# Patient Record
Sex: Male | Born: 1985 | Race: White | Hispanic: No | Marital: Single | Smoking: Current every day smoker
Health system: Southern US, Community
[De-identification: ages and names within clinical notes are randomized; demographics above are authoritative.]

## PROBLEM LIST (undated history)

## (undated) DIAGNOSIS — N201 Calculus of ureter: Secondary | ICD-10-CM

---

## 2008-02-12 ENCOUNTER — Emergency Department (HOSPITAL_COMMUNITY): Admission: EM | Admit: 2008-02-12 | Discharge: 2008-02-12 | Payer: Self-pay | Admitting: Emergency Medicine

## 2008-03-01 ENCOUNTER — Emergency Department (HOSPITAL_COMMUNITY): Admission: EM | Admit: 2008-03-01 | Discharge: 2008-03-01 | Payer: Self-pay | Admitting: Emergency Medicine

## 2008-09-12 ENCOUNTER — Ambulatory Visit: Payer: Self-pay | Admitting: Oncology

## 2008-09-19 LAB — CBC WITH DIFFERENTIAL/PLATELET
BASO%: 0.6 % (ref 0.0–2.0)
Basophils Absolute: 0.1 10*3/uL (ref 0.0–0.1)
EOS%: 3.4 % (ref 0.0–7.0)
MCH: 30.6 pg (ref 27.2–33.4)
MCHC: 34.6 g/dL (ref 32.0–36.0)
MCV: 88.7 fL (ref 79.3–98.0)
MONO%: 5.4 % (ref 0.0–14.0)
RBC: 5.06 10*6/uL (ref 4.20–5.82)
RDW: 13.6 % (ref 11.0–14.6)
lymph#: 3.5 10*3/uL — ABNORMAL HIGH (ref 0.9–3.3)

## 2008-09-19 LAB — MORPHOLOGY

## 2008-09-21 LAB — COMPREHENSIVE METABOLIC PANEL
BUN: 12 mg/dL (ref 6–23)
CO2: 21 mEq/L (ref 19–32)
Glucose, Bld: 79 mg/dL (ref 70–99)
Sodium: 138 mEq/L (ref 135–145)
Total Bilirubin: 0.5 mg/dL (ref 0.3–1.2)
Total Protein: 7.6 g/dL (ref 6.0–8.3)

## 2008-09-21 LAB — LACTATE DEHYDROGENASE: LDH: 118 U/L (ref 94–250)

## 2008-09-21 LAB — URIC ACID: Uric Acid, Serum: 6.5 mg/dL (ref 4.0–7.8)

## 2008-09-27 LAB — PORPHYRINS, FRACTIONATED URINE (TIMED COLLECTION)
Heptacarboxyl Porphyrin, 24H Ur: 0 umol/mol CRT (ref 0–2)
Porphyrins Interpretation: NORMAL
Time-PORPF: 24 hr
Total Volume, Urine: 800 mL
Uroporphyrin, Urine: 1 umol/mol CRT (ref 0–4)

## 2008-09-27 LAB — AMINOLEVULINIC ACID, 24 HOUR
Creatinine, Urine mg/dL-HMCYUR: 1520 mg/d (ref 1000–2500)
Creatinine, Urine-mg/dL-ALA: 190 mg/dL
Time-ALA: 24 hr
Total Volume, Urine: 800 mL

## 2008-10-18 ENCOUNTER — Ambulatory Visit: Payer: Self-pay | Admitting: Oncology

## 2009-04-19 ENCOUNTER — Emergency Department (HOSPITAL_COMMUNITY): Admission: EM | Admit: 2009-04-19 | Discharge: 2009-04-19 | Payer: Self-pay | Admitting: Emergency Medicine

## 2009-05-22 ENCOUNTER — Emergency Department (HOSPITAL_COMMUNITY): Admission: EM | Admit: 2009-05-22 | Discharge: 2009-05-22 | Payer: Self-pay | Admitting: Emergency Medicine

## 2010-04-23 LAB — DIFFERENTIAL
Basophils Absolute: 0 10*3/uL (ref 0.0–0.1)
Basophils Relative: 0 % (ref 0–1)
Eosinophils Relative: 1 % (ref 0–5)
Lymphocytes Relative: 15 % (ref 12–46)
Neutro Abs: 10.3 10*3/uL — ABNORMAL HIGH (ref 1.7–7.7)

## 2010-04-23 LAB — CBC
HCT: 46.5 % (ref 39.0–52.0)
MCHC: 35.5 g/dL (ref 30.0–36.0)
Platelets: 238 10*3/uL (ref 150–400)
RDW: 13 % (ref 11.5–15.5)

## 2010-04-23 LAB — POCT I-STAT, CHEM 8
Glucose, Bld: 92 mg/dL (ref 70–99)
HCT: 50 % (ref 39.0–52.0)
Hemoglobin: 17 g/dL (ref 13.0–17.0)
Potassium: 3.6 mEq/L (ref 3.5–5.1)
TCO2: 24 mmol/L (ref 0–100)

## 2010-04-23 LAB — PROTIME-INR: Prothrombin Time: 13.6 seconds (ref 11.6–15.2)

## 2010-04-29 LAB — COMPREHENSIVE METABOLIC PANEL
BUN: 12 mg/dL (ref 6–23)
CO2: 24 mEq/L (ref 19–32)
Calcium: 9.3 mg/dL (ref 8.4–10.5)
Creatinine, Ser: 0.78 mg/dL (ref 0.4–1.5)
GFR calc non Af Amer: >60 mL/min (ref >60)
Glucose, Bld: 75 mg/dL (ref 70–99)
Sodium: 139 mEq/L (ref 135–145)
Total Protein: 7.6 g/dL (ref 6.0–8.3)

## 2010-04-29 LAB — DIFFERENTIAL
Lymphocytes Relative: 26 % (ref 12–46)
Lymphs Abs: 3.8 10*3/uL (ref 0.7–4.0)
Monocytes Relative: 6 % (ref 3–12)
Neutro Abs: 9.3 10*3/uL — ABNORMAL HIGH (ref 1.7–7.7)
Neutrophils Relative %: 64 % (ref 43–77)

## 2010-04-29 LAB — CBC
MCHC: 34.3 g/dL (ref 30.0–36.0)
Platelets: 266 10*3/uL (ref 150–400)
RBC: 5.11 MIL/uL (ref 4.22–5.81)
RDW: 13.5 % (ref 11.5–15.5)

## 2010-04-29 LAB — TROPONIN I: Troponin I: 0.01 ng/mL (ref 0.00–0.06)

## 2010-04-29 LAB — CK TOTAL AND CKMB (NOT AT ARMC)
CK, MB: 2.1 ng/mL (ref 0.3–4.0)
Total CK: 155 U/L (ref 7–232)

## 2010-05-20 LAB — CBC
HCT: 47.7 % (ref 39.0–52.0)
MCV: 89.8 fL (ref 78.0–100.0)
Platelets: 270 10*3/uL (ref 150–400)
RBC: 5.32 MIL/uL (ref 4.22–5.81)
WBC: 13.8 10*3/uL — ABNORMAL HIGH (ref 4.0–10.5)

## 2010-05-20 LAB — COMPREHENSIVE METABOLIC PANEL
AST: 16 U/L (ref 0–37)
Albumin: 4.4 g/dL (ref 3.5–5.2)
Alkaline Phosphatase: 71 U/L (ref 39–117)
BUN: 11 mg/dL (ref 6–23)
CO2: 28 mEq/L (ref 19–32)
Chloride: 105 mEq/L (ref 96–112)
Creatinine, Ser: 0.87 mg/dL (ref 0.4–1.5)
GFR calc non Af Amer: >60 mL/min (ref >60)
Potassium: 3.8 mEq/L (ref 3.5–5.1)
Total Bilirubin: 1 mg/dL (ref 0.3–1.2)

## 2010-05-20 LAB — URINALYSIS, ROUTINE W REFLEX MICROSCOPIC
Bilirubin Urine: NEGATIVE
Ketones, ur: 15 mg/dL — AB
Nitrite: NEGATIVE
Protein, ur: NEGATIVE mg/dL
Urobilinogen, UA: 1 mg/dL (ref 0.0–1.0)

## 2010-05-20 LAB — DIFFERENTIAL
Basophils Absolute: 0.1 10*3/uL (ref 0.0–0.1)
Basophils Relative: 1 % (ref 0–1)
Eosinophils Relative: 3 % (ref 0–5)
Lymphocytes Relative: 25 % (ref 12–46)
Monocytes Absolute: 0.8 10*3/uL (ref 0.1–1.0)

## 2010-06-17 IMAGING — CT CT PELVIS W/ CM
2 of 5 series · 17 of 46 positions shown, 19 images · IV contrast (omniscan)
Comparison: None

CT ABDOMEN

CLINICAL DATA: Right lower quadrant abdominal pain, nausea

CT ABDOMEN AND PELVIS WITH CONTRAST
TECHNIQUE: Multidetector CT imaging of the abdomen and pelvis was
performed using the standard protocol following bolus
administration of intravenous contrast.
Contrast: 125 ml Omniscan 300 IV contrast

[Series 2: abd_pel 5.0 b40f st · axial · 0.73mm/px · z∈[-568,-138]mm · 14 of 98 slices shown, 16 images]
[im 6/98  soft-tissue]
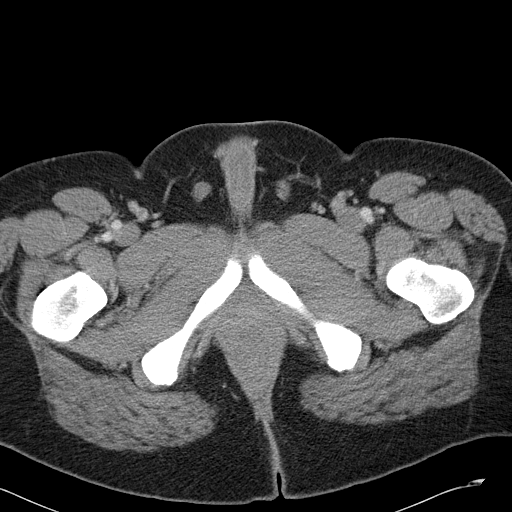
[im 6/98  bone]
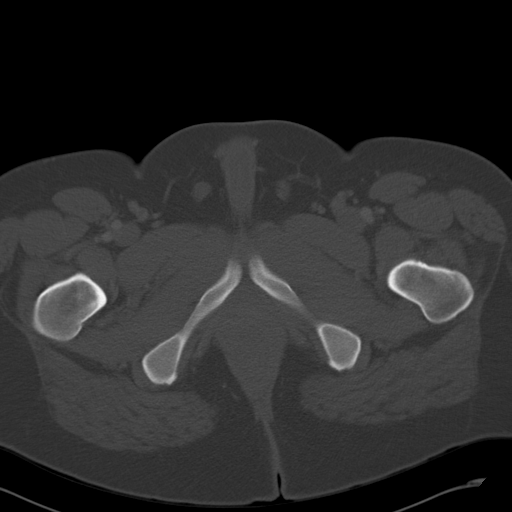
[im 11/98  soft-tissue]
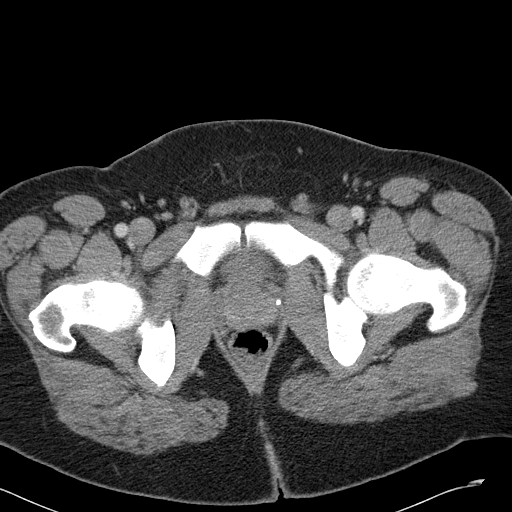
[im 21/98  soft-tissue]
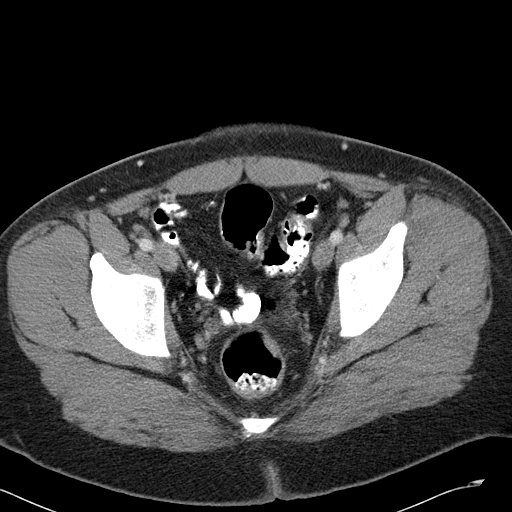
[im 26/98  soft-tissue]
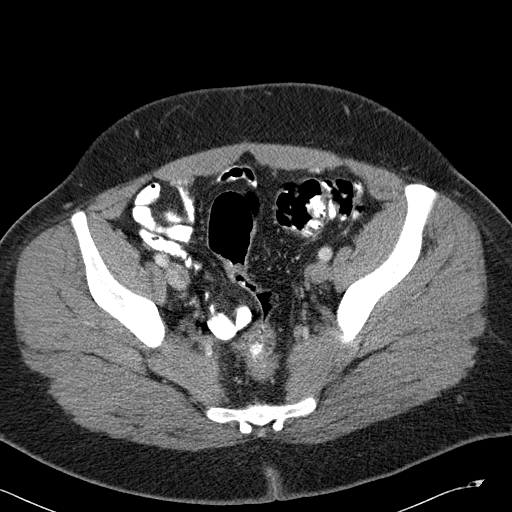
[im 31/98  soft-tissue]
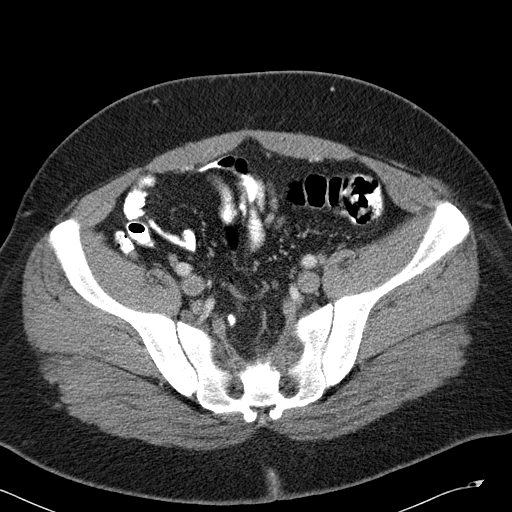
[im 41/98  soft-tissue]
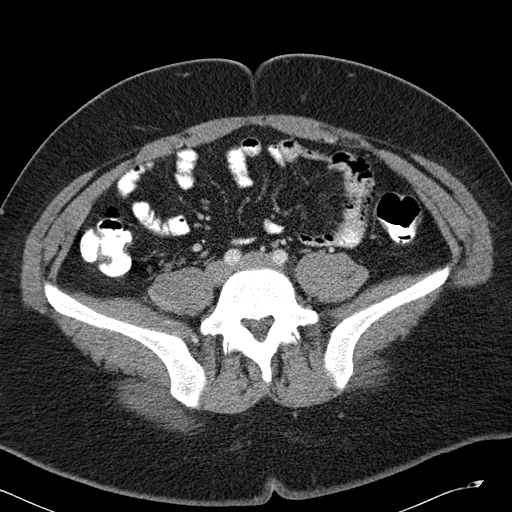
[im 46/98  soft-tissue]
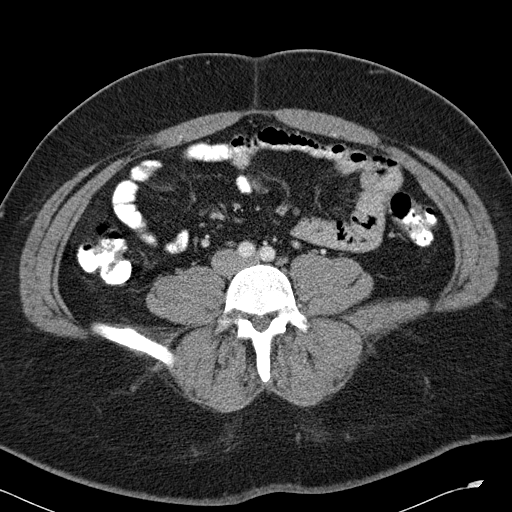
[im 52/98  soft-tissue]
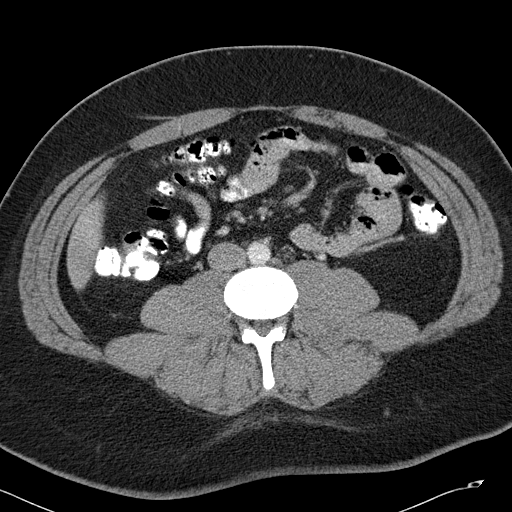
[im 57/98  soft-tissue]
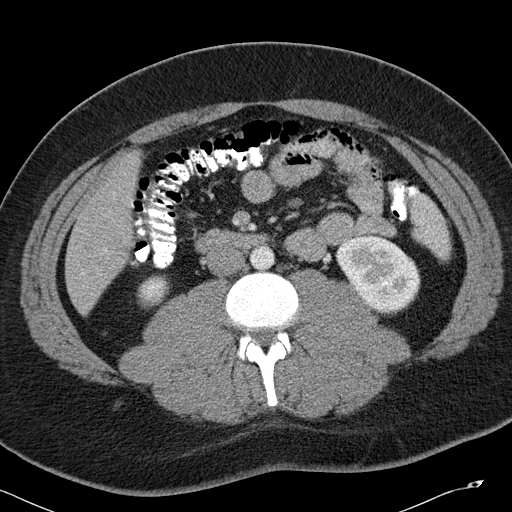
[im 57/98  bone]
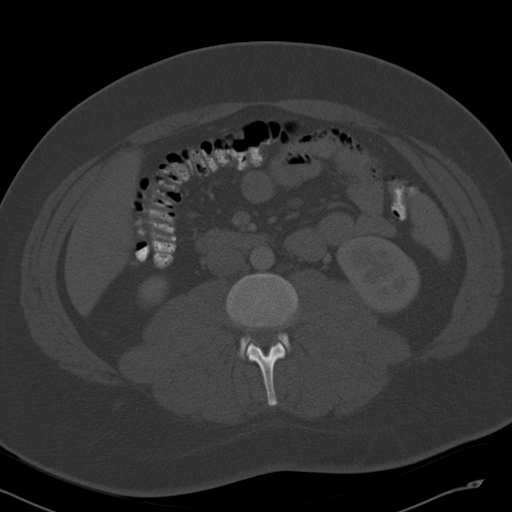
[im 67/98  soft-tissue]
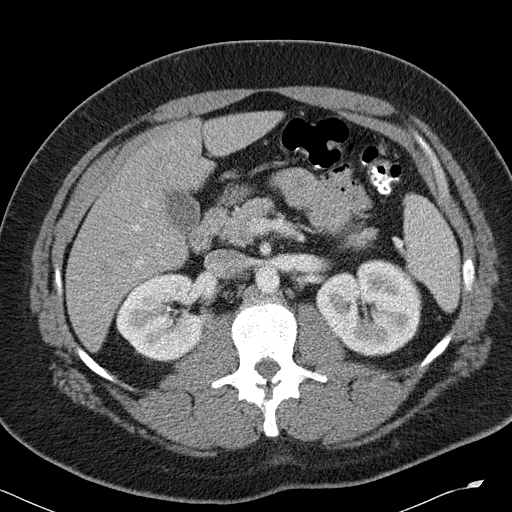
[im 72/98  soft-tissue]
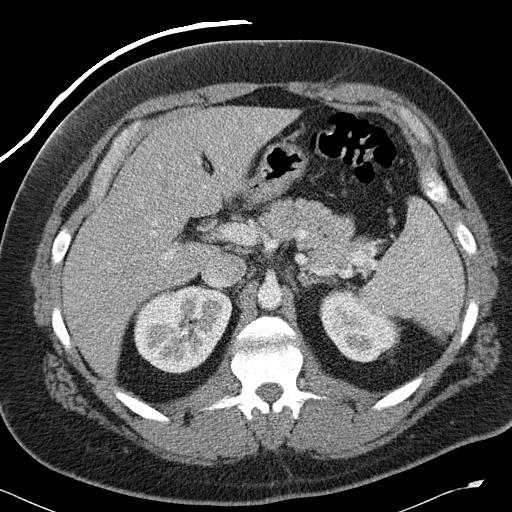
[im 77/98  soft-tissue]
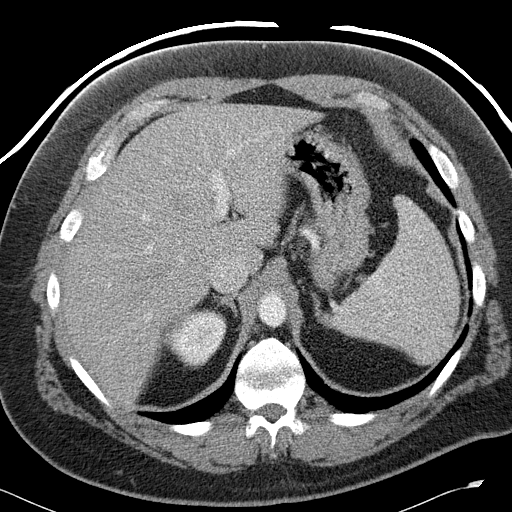
[im 87/98  soft-tissue]
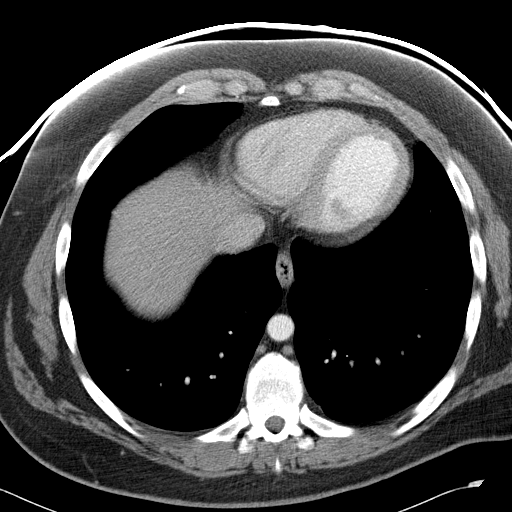
[im 92/98  soft-tissue]
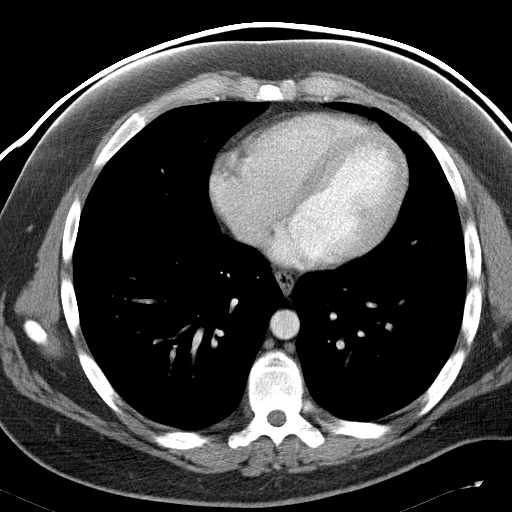

[Series 602: coronal · coronal · 0.99mm/px · 3 of 87 slices shown]
[im 29/87  soft-tissue]
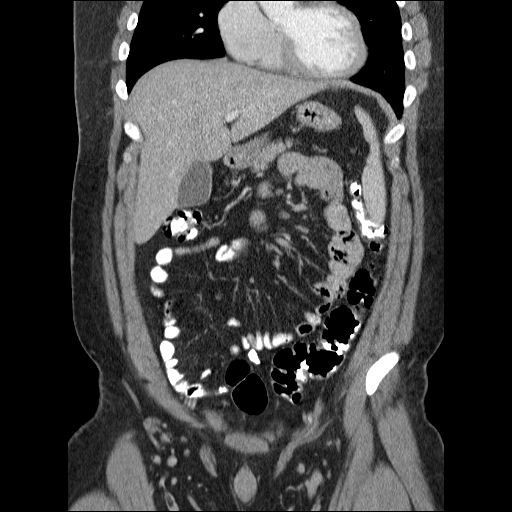
[im 39/87  soft-tissue]
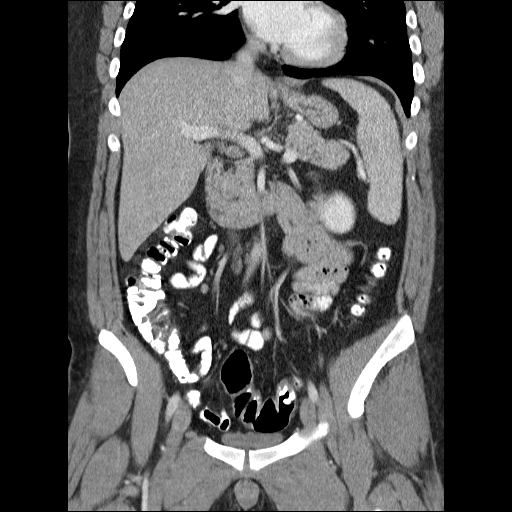
[im 48/87  soft-tissue]
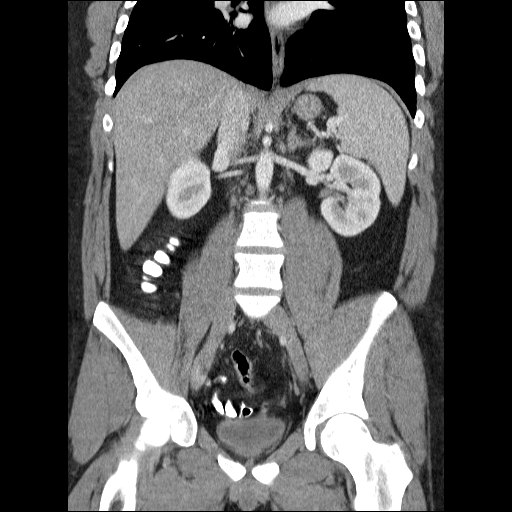

[17 of 46 positions shown; findings below may reference images not displayed]

FINDINGS: Minimal dependent bibasilar atelectasis or scarring is
noted.  Abdominal viscera are unremarkable.  No ascites or free
air.  No lymphadenopathy.
IMPRESSION: No acute intra-abdominal finding.

CT PELVIS
FINDINGS: The colon, appendix, and small bowel are unremarkable.
No pelvic free fluid or lymphadenopathy.  No acute osseous finding.
IMPRESSION: No acute intrapelvic finding.

## 2012-09-14 ENCOUNTER — Encounter (HOSPITAL_COMMUNITY): Payer: Self-pay | Admitting: *Deleted

## 2012-09-14 ENCOUNTER — Emergency Department (HOSPITAL_COMMUNITY): Payer: Self-pay

## 2012-09-14 ENCOUNTER — Emergency Department (HOSPITAL_COMMUNITY)
Admission: EM | Admit: 2012-09-14 | Discharge: 2012-09-14 | Disposition: A | Discharge status: Home / Self Care | Payer: Self-pay | Attending: Emergency Medicine | Admitting: Emergency Medicine

## 2012-09-14 DIAGNOSIS — F17200 Nicotine dependence, unspecified, uncomplicated: Secondary | ICD-10-CM | POA: Insufficient documentation

## 2012-09-14 DIAGNOSIS — S46909A Unspecified injury of unspecified muscle, fascia and tendon at shoulder and upper arm level, unspecified arm, initial encounter: Secondary | ICD-10-CM | POA: Insufficient documentation

## 2012-09-14 DIAGNOSIS — S4980XA Other specified injuries of shoulder and upper arm, unspecified arm, initial encounter: Secondary | ICD-10-CM | POA: Insufficient documentation

## 2012-09-14 DIAGNOSIS — M25512 Pain in left shoulder: Secondary | ICD-10-CM

## 2012-09-14 DIAGNOSIS — Y9289 Other specified places as the place of occurrence of the external cause: Secondary | ICD-10-CM | POA: Insufficient documentation

## 2012-09-14 DIAGNOSIS — R209 Unspecified disturbances of skin sensation: Secondary | ICD-10-CM | POA: Insufficient documentation

## 2012-09-14 DIAGNOSIS — Y9389 Activity, other specified: Secondary | ICD-10-CM | POA: Insufficient documentation

## 2012-09-14 DIAGNOSIS — W010XXA Fall on same level from slipping, tripping and stumbling without subsequent striking against object, initial encounter: Secondary | ICD-10-CM | POA: Insufficient documentation

## 2012-09-14 MED ORDER — OXYCODONE-ACETAMINOPHEN 5-325 MG PO TABS: 1.0000 | ORAL_TABLET | ORAL | Status: DC | PRN

## 2012-09-14 NOTE — ED Provider Notes (Signed)
CSN: [id number]     Arrival date & time 09/14/12  1115 History     First MD Initiated Contact with Patient 09/14/12 1228     Chief Complaint  Patient presents with  . Shoulder Pain   (Consider location/radiation/quality/duration/timing/severity/associated sxs/prior Treatment) Patient is a 27 y.o. male presenting with shoulder pain. The history is provided by the patient and medical records.  Shoulder Pain Associated symptoms include arthralgias.   Patient returns to the ED for left shoulder pain after a fall yesterday. Patient states he slipped on some wet steel causing him to lose his balance, and on his elbow and left shoulder. Denies head trauma or LOC.  Has had persistent, throbbing pain since injury. Patient states he cannot lift his arm or pick anything up. Has some intermittent paresthesias in an ulnar distribution of left hand. Patient denies any prior left shoulder injury.  Denies any numbness of left upper extremity. Patient has tried to treat at home with heat, ice, and over-the-counter pain medications without significant relief.  History reviewed. No pertinent past medical history. History reviewed. No pertinent past surgical history. No family history on file. History  Substance Use Topics  . Smoking status: Current Every Day Smoker  . Smokeless tobacco: Not on file  . Alcohol Use: No    Review of Systems  Musculoskeletal: Positive for arthralgias.  All other systems reviewed and are negative.    Allergies  Demerol  Home Medications  No current outpatient prescriptions on file. BP 147/92  Pulse 92  Temp(Src) 99.3 F (37.4 C) (Oral)  Resp 25  Ht 6' (1.829 m)  Wt 340 lb (154.223 kg)  BMI 46.1 kg/m2  SpO2 97%  Physical Exam  Nursing note and vitals reviewed. Constitutional: He is oriented to person, place, and time. He appears well-developed and well-nourished.  HENT:  Head: Normocephalic and atraumatic. Head is without raccoon's eyes, without Battle's  sign, without abrasion, without contusion and without laceration.  Eyes: Conjunctivae and EOM are normal.  Neck: Normal range of motion. Neck supple.  Cardiovascular: Normal rate, regular rhythm and normal heart sounds.   Pulmonary/Chest: Effort normal and breath sounds normal.  Musculoskeletal: He exhibits no edema.       Left shoulder: He exhibits decreased range of motion, tenderness, bony tenderness and pain. He exhibits no swelling, no effusion, no crepitus, no deformity, no laceration, no spasm, normal pulse and normal strength.  TTP left AC joint, no swelling, bruising, or deformity noted; grip strength appropriate; strong radial pulse and cap refill, sensation intact  Neurological: He is alert and oriented to person, place, and time.  Skin: Skin is warm and dry.  Psychiatric: He has a normal mood and affect.    ED Course   Procedures (including critical care time)  Labs Reviewed - No data to display Dg Shoulder Left  09/14/2012   *RADIOLOGY REPORT*  Clinical Data: Pain post trauma  LEFT SHOULDER - 2+ VIEW  Comparison: None.  Findings: Frontal, Y scapular, and lateral views were obtained. There is evidence of an old healed fracture of the lateral left clavicle.  There is no acute fracture or dislocation.  Joint spaces appear intact.  No erosive change or intra-articular calcifications.  IMPRESSION: No acute fracture or dislocation.  Old healed fracture lateral left clavicle.   Original Report Authenticated By: Bretta Bang, M.D.   1. Shoulder pain, acute, left     MDM   X-ray revealing old, healed fracture left clavicle-- patient denies any prior left shoulder  or clavicle injury. Sling placed for comfort.  Rx Percocet. Patient will followup with orthopedics, Dr. Charlann Boxer, if symptoms not improving in one week.  Discussed plan with patient, he agreed. Return precautions advised.  Garlon Hatchet, PA-C 09/14/12 1404

## 2012-09-14 NOTE — ED Notes (Signed)
Patient reports he fell onto his left shoulder on yesterday.  He states he tried to treat at home but his pain is worse.  He states he landed on his elbow which jarred his shoulder.  Patient states he cannot move the shoulder fully due to pain.  He also report tingling in the finger on the left side as well

## 2012-09-15 NOTE — ED Provider Notes (Signed)
Medical screening examination/treatment/procedure(s) were performed by non-physician practitioner and as supervising physician I was immediately available for consultation/collaboration.   Jasmia Angst M Tori Cupps, DO 09/15/12 2203 

## 2013-03-25 ENCOUNTER — Encounter (HOSPITAL_COMMUNITY): Payer: Self-pay | Admitting: Emergency Medicine

## 2013-03-25 ENCOUNTER — Emergency Department (HOSPITAL_COMMUNITY): Payer: Self-pay

## 2013-03-25 ENCOUNTER — Emergency Department (HOSPITAL_COMMUNITY)
Admission: EM | Admit: 2013-03-25 | Discharge: 2013-03-25 | Disposition: A | Discharge status: Home / Self Care | Payer: Self-pay | Attending: Emergency Medicine | Admitting: Emergency Medicine

## 2013-03-25 DIAGNOSIS — R51 Headache: Secondary | ICD-10-CM | POA: Insufficient documentation

## 2013-03-25 DIAGNOSIS — B9789 Other viral agents as the cause of diseases classified elsewhere: Secondary | ICD-10-CM | POA: Insufficient documentation

## 2013-03-25 DIAGNOSIS — R059 Cough, unspecified: Secondary | ICD-10-CM | POA: Insufficient documentation

## 2013-03-25 DIAGNOSIS — B349 Viral infection, unspecified: Secondary | ICD-10-CM

## 2013-03-25 DIAGNOSIS — R42 Dizziness and giddiness: Secondary | ICD-10-CM | POA: Insufficient documentation

## 2013-03-25 DIAGNOSIS — R05 Cough: Secondary | ICD-10-CM | POA: Insufficient documentation

## 2013-03-25 DIAGNOSIS — R112 Nausea with vomiting, unspecified: Secondary | ICD-10-CM | POA: Insufficient documentation

## 2013-03-25 DIAGNOSIS — R5383 Other fatigue: Secondary | ICD-10-CM

## 2013-03-25 DIAGNOSIS — R5381 Other malaise: Secondary | ICD-10-CM | POA: Insufficient documentation

## 2013-03-25 DIAGNOSIS — F17200 Nicotine dependence, unspecified, uncomplicated: Secondary | ICD-10-CM | POA: Insufficient documentation

## 2013-03-25 LAB — CBC WITH DIFFERENTIAL/PLATELET
BASOS ABS: 0 10*3/uL (ref 0.0–0.1)
Basophils Relative: 0 % (ref 0–1)
EOS ABS: 0 10*3/uL (ref 0.0–0.7)
EOS PCT: 0 % (ref 0–5)
HCT: 44.9 % (ref 39.0–52.0)
Hemoglobin: 16.3 g/dL (ref 13.0–17.0)
LYMPHS ABS: 1.1 10*3/uL (ref 0.7–4.0)
LYMPHS PCT: 14 % (ref 12–46)
MCH: 31 pg (ref 26.0–34.0)
MCHC: 36.3 g/dL — ABNORMAL HIGH (ref 30.0–36.0)
MCV: 85.4 fL (ref 78.0–100.0)
Monocytes Absolute: 0.8 10*3/uL (ref 0.1–1.0)
Monocytes Relative: 11 % (ref 3–12)
NEUTROS PCT: 75 % (ref 43–77)
Neutro Abs: 5.6 10*3/uL (ref 1.7–7.7)
PLATELETS: 194 10*3/uL (ref 150–400)
RBC: 5.26 MIL/uL (ref 4.22–5.81)
RDW: 13.4 % (ref 11.5–15.5)
WBC: 7.5 10*3/uL (ref 4.0–10.5)

## 2013-03-25 LAB — URINE MICROSCOPIC-ADD ON

## 2013-03-25 LAB — URINALYSIS, ROUTINE W REFLEX MICROSCOPIC
GLUCOSE, UA: NEGATIVE mg/dL
HGB URINE DIPSTICK: NEGATIVE
Ketones, ur: 15 mg/dL — AB
Leukocytes, UA: NEGATIVE
Nitrite: NEGATIVE
PH: 5.5 (ref 5.0–8.0)
Protein, ur: 30 mg/dL — AB
SPECIFIC GRAVITY, URINE: 1.028 (ref 1.005–1.030)
Urobilinogen, UA: 1 mg/dL (ref 0.0–1.0)

## 2013-03-25 LAB — COMPREHENSIVE METABOLIC PANEL
ALK PHOS: 77 U/L (ref 39–117)
ALT: 31 U/L (ref 0–53)
AST: 34 U/L (ref 0–37)
Albumin: 4 g/dL (ref 3.5–5.2)
BUN: 10 mg/dL (ref 6–23)
CALCIUM: 9.2 mg/dL (ref 8.4–10.5)
CO2: 24 mEq/L (ref 19–32)
Chloride: 95 mEq/L — ABNORMAL LOW (ref 96–112)
Creatinine, Ser: 0.98 mg/dL (ref 0.50–1.35)
GFR calc Af Amer: >90 mL/min (ref >90)
GFR calc non Af Amer: >90 mL/min (ref >90)
GLUCOSE: 76 mg/dL (ref 70–99)
POTASSIUM: 4.1 meq/L (ref 3.7–5.3)
Sodium: 136 mEq/L — ABNORMAL LOW (ref 137–147)
TOTAL PROTEIN: 8.2 g/dL (ref 6.0–8.3)
Total Bilirubin: 0.5 mg/dL (ref 0.3–1.2)

## 2013-03-25 LAB — LIPASE, BLOOD: Lipase: 16 U/L (ref 11–59)

## 2013-03-25 MED ORDER — KETOROLAC TROMETHAMINE 30 MG/ML IJ SOLN
30.0000 mg | Freq: Once | INTRAMUSCULAR | Status: AC
  Administered 2013-03-25: 30 mg via INTRAVENOUS
  Filled 2013-03-25: qty 1

## 2013-03-25 MED ORDER — MORPHINE SULFATE 4 MG/ML IJ SOLN
4.0000 mg | Freq: Once | INTRAMUSCULAR | Status: AC
  Administered 2013-03-25: 4 mg via INTRAVENOUS
  Filled 2013-03-25: qty 1

## 2013-03-25 MED ORDER — ONDANSETRON HCL 4 MG/2ML IJ SOLN
4.0000 mg | Freq: Once | INTRAMUSCULAR | Status: AC
Start: 2013-03-25 — End: 2013-03-25
  Administered 2013-03-25: 4 mg via INTRAVENOUS
  Filled 2013-03-25: qty 2

## 2013-03-25 MED ORDER — ALBUTEROL SULFATE HFA 108 (90 BASE) MCG/ACT IN AERS: 2.0000 | INHALATION_SPRAY | RESPIRATORY_TRACT | Status: DC | PRN

## 2013-03-25 MED ORDER — ONDANSETRON HCL 4 MG PO TABS: 4.0000 mg | ORAL_TABLET | Freq: Four times a day (QID) | ORAL | Status: DC

## 2013-03-25 MED ORDER — HYDROCOD POLST-CHLORPHEN POLST 10-8 MG/5ML PO LQCR: 5.0000 mL | Freq: Two times a day (BID) | ORAL | Status: DC | PRN

## 2013-03-25 NOTE — ED Notes (Signed)
Pt states meds helped cough still in pain

## 2013-03-25 NOTE — ED Notes (Signed)
Pt. Reports having flu-like symptoms, body aches, fever, cough, Weakness.  Pt. Took Motrin and alka -seltzer last night and woke up feeling not any better.  Bilateral rib pain from coughing, Chills and fever

## 2013-03-25 NOTE — Discharge Instructions (Signed)

## 2013-03-25 NOTE — ED Provider Notes (Signed)
CSN: [id number]     Arrival date & time 03/25/13  [id number] History   First MD Initiated Contact with Patient 03/25/13 [phone]     Chief Complaint  Patient presents with  . Fever     (Consider location/radiation/quality/duration/timing/severity/associated sxs/prior Treatment) HPI Comments: Patient presents to ER for evaluation of flulike symptoms. Patient reports that symptoms began 36 hours ago with cough and chest congestion. Last night he developed nausea, vomiting. He was up all night with no vomiting, cannot hold anything down. He reports that the cough has worsened as well, complains of pain in both sides of his ribs when he coughs. He has not had any diarrhea. He is complaining of slowly developing and progressing headache generalized myalgias. No neck pain or stiffness.   History reviewed. No pertinent past medical history. History reviewed. No pertinent past surgical history. No family history on file. History  Substance Use Topics  . Smoking status: Current Every Day Smoker    Types: Cigarettes  . Smokeless tobacco: Not on file  . Alcohol Use: No    Review of Systems  Respiratory: Positive for cough.   Gastrointestinal: Positive for nausea and vomiting. Negative for diarrhea.  Musculoskeletal: Positive for myalgias.  Neurological: Positive for dizziness and headaches.  All other systems reviewed and are negative.      Allergies  Demerol  Home Medications   Current Outpatient Rx  Name  Route  Sig  Dispense  Refill  . aspirin-sod bicarb-citric acid (ALKA-SELTZER) 325 MG TBEF tablet   Oral   Take 325 mg by mouth every 6 (six) hours as needed.         Marland Kitchen. ibuprofen (ADVIL,MOTRIN) 200 MG tablet   Oral   Take 600 mg by mouth every 6 (six) hours as needed.         Marland Kitchen. oxyCODONE-acetaminophen (PERCOCET/ROXICET) 5-325 MG per tablet   Oral   Take 1 tablet by mouth every 4 (four) hours as needed for pain.   15 tablet   0    BP 121/66  Pulse 93  Temp(Src) 99.3 F  (37.4 C) (Oral)  Resp 9  Ht 5\' 11"  (1.803 m)  Wt 330 lb (149.687 kg)  BMI 46.05 kg/m2  SpO2 92% Physical Exam  Constitutional: He is oriented to person, place, and time. He appears well-developed and well-nourished. No distress.  HENT:  Head: Normocephalic and atraumatic.  Right Ear: Hearing normal.  Left Ear: Hearing normal.  Nose: Nose normal.  Mouth/Throat: Oropharynx is clear and moist and mucous membranes are normal.  Eyes: Conjunctivae and EOM are normal. Pupils are equal, round, and reactive to light.  Neck: Normal range of motion. Neck supple.  Cardiovascular: Regular rhythm, S1 normal and S2 normal.  Exam reveals no gallop and no friction rub.   No murmur heard. Pulmonary/Chest: Effort normal and breath sounds normal. No respiratory distress. He exhibits no tenderness.  Abdominal: Soft. Normal appearance and bowel sounds are normal. There is no hepatosplenomegaly. There is no tenderness. There is no rebound, no guarding, no tenderness at McBurney's point and negative Murphy's sign. No hernia.  Musculoskeletal: Normal range of motion.  Neurological: He is alert and oriented to person, place, and time. He has normal strength. No cranial nerve deficit or sensory deficit. Coordination normal. GCS eye subscore is 4. GCS verbal subscore is 5. GCS motor subscore is 6.  Skin: Skin is warm, dry and intact. No rash noted. No cyanosis.  Psychiatric: He has a normal mood and affect. His speech  is normal and behavior is normal. Thought content normal.    ED Course  Procedures (including critical care time) Labs Review Labs Reviewed  CBC WITH DIFFERENTIAL - Abnormal; Notable for the following:    MCHC 36.3 (*)    All other components within normal limits  COMPREHENSIVE METABOLIC PANEL - Abnormal; Notable for the following:    Sodium 136 (*)    Chloride 95 (*)    All other components within normal limits  LIPASE, BLOOD  URINALYSIS, ROUTINE W REFLEX MICROSCOPIC   Imaging Review Dg  Chest 2 View  03/25/2013   CLINICAL DATA:  28 year old male cough headache dizziness. Initial encounter.  EXAM: CHEST  2 VIEW  COMPARISON:  04/19/2009.  FINDINGS: Chronic large body habitus. Stable lung volumes. The lungs are clear. No pneumothorax or effusion. Chronic mild prominence of both hilar contours, stable and not significantly changed since 2010. Other mediastinal contours are within normal limits. Visualized tracheal air column is within normal limits. Stable visualized osseous structures.  IMPRESSION: No acute cardiopulmonary abnormality.   Electronically Signed   By: Augusto Gamble M.D.   On: 03/25/2013 08:42    EKG Interpretation    Date/Time:  Friday March 25 2013 08:05:37 EST Ventricular Rate:  116 PR Interval:  148 QRS Duration: 78 QT Interval:  299 QTC Calculation: 415 R Axis:   -3 Text Interpretation:  Sinus tachycardia Probable left atrial enlargement compared to prior, tachycardia now present o/w normal Confirmed by Cordale Manera  MD, Deajah Erkkila ([id number]) on 03/25/2013 8:11:14 AM            MDM   Final diagnoses:  Viral Syndrome    Patient presents to ER with flulike symptoms. He has generalized body aches, fever, headache, cough, vomiting. Abdominal exam is benign and nontender. Headache is not concerning, it is slow in onset and he does not have any meningismus. He did have a low-grade fever here in the ER. Patient treated with fluids, Toradol, Zofran. Labs are entirely unremarkable. This is consistent with viral syndrome, will be treated symptomatically as an outpatient.    Gilda Crease, MD 03/25/13 [phone]

## 2014-08-17 ENCOUNTER — Emergency Department (HOSPITAL_COMMUNITY): Payer: 59

## 2014-08-17 ENCOUNTER — Emergency Department (HOSPITAL_COMMUNITY)
Admission: EM | Admit: 2014-08-17 | Discharge: 2014-08-17 | Disposition: A | Discharge status: Home / Self Care | Payer: 59 | Attending: Emergency Medicine | Admitting: Emergency Medicine

## 2014-08-17 ENCOUNTER — Encounter (HOSPITAL_COMMUNITY): Payer: Self-pay | Admitting: Neurology

## 2014-08-17 DIAGNOSIS — Z720 Tobacco use: Secondary | ICD-10-CM | POA: Insufficient documentation

## 2014-08-17 DIAGNOSIS — Z79899 Other long term (current) drug therapy: Secondary | ICD-10-CM | POA: Insufficient documentation

## 2014-08-17 DIAGNOSIS — N10 Acute tubulo-interstitial nephritis: Secondary | ICD-10-CM | POA: Diagnosis not present

## 2014-08-17 DIAGNOSIS — R109 Unspecified abdominal pain: Secondary | ICD-10-CM | POA: Diagnosis present

## 2014-08-17 LAB — COMPREHENSIVE METABOLIC PANEL
ALT: 35 U/L (ref 17–63)
ANION GAP: 9 (ref 5–15)
AST: 22 U/L (ref 15–41)
Albumin: 4.3 g/dL (ref 3.5–5.0)
Alkaline Phosphatase: 76 U/L (ref 38–126)
BUN: 11 mg/dL (ref 6–20)
CO2: 25 mmol/L (ref 22–32)
Calcium: 9.6 mg/dL (ref 8.9–10.3)
Chloride: 107 mmol/L (ref 101–111)
Creatinine, Ser: 1.13 mg/dL (ref 0.61–1.24)
GFR calc Af Amer: >60 mL/min (ref >60)
Glucose, Bld: 97 mg/dL (ref 65–99)
Potassium: 4.6 mmol/L (ref 3.5–5.1)
Sodium: 141 mmol/L (ref 135–145)
TOTAL PROTEIN: 7.7 g/dL (ref 6.5–8.1)
Total Bilirubin: 0.6 mg/dL (ref 0.3–1.2)

## 2014-08-17 LAB — URINALYSIS, ROUTINE W REFLEX MICROSCOPIC
Glucose, UA: NEGATIVE mg/dL
Ketones, ur: 15 mg/dL — AB
Nitrite: POSITIVE — AB
Specific Gravity, Urine: 1.023 (ref 1.005–1.030)
Urobilinogen, UA: 1 mg/dL (ref 0.0–1.0)
pH: 5 (ref 5.0–8.0)

## 2014-08-17 LAB — CBC
HCT: 48 % (ref 39.0–52.0)
Hemoglobin: 16.6 g/dL (ref 13.0–17.0)
MCH: 29.8 pg (ref 26.0–34.0)
MCHC: 34.6 g/dL (ref 30.0–36.0)
MCV: 86.2 fL (ref 78.0–100.0)
PLATELETS: 300 10*3/uL (ref 150–400)
RBC: 5.57 MIL/uL (ref 4.22–5.81)
RDW: 13.3 % (ref 11.5–15.5)
WBC: 14.5 10*3/uL — ABNORMAL HIGH (ref 4.0–10.5)

## 2014-08-17 LAB — URINE MICROSCOPIC-ADD ON

## 2014-08-17 MED ORDER — HYDROMORPHONE HCL 1 MG/ML IJ SOLN
1.0000 mg | Freq: Once | INTRAMUSCULAR | Status: AC
  Administered 2014-08-17: 1 mg via INTRAVENOUS
  Filled 2014-08-17: qty 1

## 2014-08-17 MED ORDER — FENTANYL CITRATE (PF) 100 MCG/2ML IJ SOLN
50.0000 ug | Freq: Once | INTRAMUSCULAR | Status: AC
  Administered 2014-08-17: 50 ug via NASAL

## 2014-08-17 MED ORDER — OXYCODONE-ACETAMINOPHEN 5-325 MG PO TABS: 1.0000 | ORAL_TABLET | Freq: Four times a day (QID) | ORAL | Status: DC | PRN

## 2014-08-17 MED ORDER — CEFTRIAXONE SODIUM 1 G IJ SOLR
1.0000 g | Freq: Once | INTRAMUSCULAR | Status: AC
  Administered 2014-08-17: 1 g via INTRAVENOUS
  Filled 2014-08-17: qty 10

## 2014-08-17 MED ORDER — CEPHALEXIN 500 MG PO CAPS: 500.0000 mg | ORAL_CAPSULE | Freq: Four times a day (QID) | ORAL | Status: DC

## 2014-08-17 MED ORDER — SODIUM CHLORIDE 0.9 % IV BOLUS (SEPSIS)
1000.0000 mL | Freq: Once | INTRAVENOUS | Status: AC
  Administered 2014-08-17: 1000 mL via INTRAVENOUS

## 2014-08-17 MED ORDER — ONDANSETRON HCL 4 MG/2ML IJ SOLN
4.0000 mg | Freq: Once | INTRAMUSCULAR | Status: AC
  Administered 2014-08-17: 4 mg via INTRAVENOUS
  Filled 2014-08-17: qty 2

## 2014-08-17 MED ORDER — ONDANSETRON 4 MG PO TBDP: ORAL_TABLET | ORAL | Status: DC

## 2014-08-17 MED ORDER — FENTANYL CITRATE (PF) 100 MCG/2ML IJ SOLN
INTRAMUSCULAR | Status: DC
Start: 2014-08-17 — End: 2014-08-17
  Filled 2014-08-17: qty 2

## 2014-08-17 NOTE — ED Provider Notes (Signed)
CSN: [id number]     Arrival date & time 08/17/14  1052 History   First MD Initiated Contact with Patient 08/17/14 1513     Chief Complaint  Patient presents with  . Flank Pain     (Consider location/radiation/quality/duration/timing/severity/associated sxs/prior Treatment) HPI Comments: Patient presents today with a chief complaint of left flank pain.  He reports sudden onset of pain just prior to arrival.  He reports that the pain radiates to the left lower abdomen.  He reports that at times the pain becomes worse.  Nothing in particular makes it worse.  He reports pain is associated with some hematuria and some nausea.  He denies vomiting.  He does also reports associated dysuria and increased urinary frequency.  He denies fever or chills.  Denies any scrotal pain or swelling.  Denies any prior history of kidney stones or UTI.  The history is provided by the patient.    History reviewed. No pertinent past medical history. No past surgical history on file. No family history on file. History  Substance Use Topics  . Smoking status: Current Every Day Smoker -- 0.50 packs/day    Types: Cigarettes  . Smokeless tobacco: Not on file  . Alcohol Use: No    Review of Systems  All other systems reviewed and are negative.     Allergies  Demerol  Home Medications   Prior to Admission medications   Medication Sig Start Date End Date Taking? Authorizing Provider  albuterol (PROVENTIL HFA;VENTOLIN HFA) 108 (90 BASE) MCG/ACT inhaler Inhale 2 puffs into the lungs every 4 (four) hours as needed for wheezing or shortness of breath. 03/25/13   Gilda Crease, MD  aspirin-sod bicarb-citric acid (ALKA-SELTZER) 325 MG TBEF tablet Take 325 mg by mouth every 6 (six) hours as needed.    Historical Provider, MD  chlorpheniramine-HYDROcodone (TUSSIONEX PENNKINETIC ER) 10-8 MG/5ML LQCR Take 5 mLs by mouth every 12 (twelve) hours as needed for cough. 03/25/13   Gilda Crease, MD  ibuprofen  (ADVIL,MOTRIN) 200 MG tablet Take 600 mg by mouth every 6 (six) hours as needed.    Historical Provider, MD  ondansetron (ZOFRAN) 4 MG tablet Take 1 tablet (4 mg total) by mouth every 6 (six) hours. 03/25/13   Gilda Crease, MD  oxyCODONE-acetaminophen (PERCOCET/ROXICET) 5-325 MG per tablet Take 1 tablet by mouth every 4 (four) hours as needed for pain. 09/14/12   Garlon Hatchet, PA-C   BP 167/76 mmHg  Pulse 79  Temp(Src) 97.7 F (36.5 C) (Oral)  Resp 18  Ht  (1.778 m)  Wt 335 lb (151.955 kg)  BMI 48.07 kg/m2  SpO2 94% Physical Exam  Constitutional: He appears well-developed and well-nourished.  HENT:  Head: Normocephalic and atraumatic.  Mouth/Throat: Oropharynx is clear and moist.  Neck: Normal range of motion. Neck supple.  Cardiovascular: Normal rate, regular rhythm and normal heart sounds.   Pulmonary/Chest: Effort normal and breath sounds normal.  Abdominal: Soft. Bowel sounds are normal. He exhibits no distension and no mass. There is no tenderness. There is CVA tenderness. There is no rebound and no guarding.  Left CVA tenderness  Neurological: He is alert.  Skin: Skin is warm and dry.  Psychiatric: He has a normal mood and affect.  Nursing note and vitals reviewed.   ED Course  Procedures (including critical care time) Labs Review Labs Reviewed  CBC - Abnormal; Notable for the following:    WBC 14.5 (*)    All other components within normal  limits  URINALYSIS, ROUTINE W REFLEX MICROSCOPIC ([facility]) - Abnormal; Notable for the following:    Color, Urine BROWN (*)    APPearance TURBID (*)    Hgb urine dipstick LARGE (*)    Bilirubin Urine LARGE (*)    Ketones, ur 15 (*)    Protein, ur >300 (*)    Nitrite POSITIVE (*)    Leukocytes, UA MODERATE (*)    All other components within normal limits  URINE MICROSCOPIC-ADD ON - Abnormal; Notable for the following:    Squamous Epithelial / LPF MANY (*)    Bacteria, UA MANY (*)    All other components  within normal limits  URINE CULTURE  COMPREHENSIVE METABOLIC PANEL    Imaging Review Ct Renal Stone Study  08/17/2014   CLINICAL DATA:  29 year old male with left all flank pain and hematuria  EXAM: CT ABDOMEN AND PELVIS WITHOUT CONTRAST  TECHNIQUE: Multidetector CT imaging of the abdomen and pelvis was performed following the standard protocol without IV contrast.  COMPARISON:  CT dated 05/22/2009  FINDINGS: The visualized lung bases are clear. No intra-abdominal free air or free fluid.  The liver, gallbladder, pancreas, spleen, adrenal glands, kidneys, visualized ureters and urinary bladder appear unremarkable the prostate and seminal vesicles are grossly unremarkable.  There is no evidence of bowel obstruction or inflammation. Normal appendix.  The visualized abdominal aorta and IVC appear grossly unremarkable on this noncontrast study. There is mild haziness of the mesentery with multiple top-normal lymph nodes with a "misty mesentery" appearance. This finding is nonspecific but may be related to underlying inflammatory/infectious etiology. The soft tissues of the abdominal wall appear unremarkable. Mild degenerative changes of the spine. Lower thoracic compression fractures most prominent at T10 with mild anterior wedging, likely old. No definite acute fracture.  IMPRESSION: No acute intra-abdominal or pelvic pathology. No hydronephrosis or nephrolithiasis.   Electronically Signed   By: Elgie CollardArash  Radparvar M.D.   On: 08/17/2014 17:07     EKG Interpretation None     5:00 PM Patient signed out to Ailene Ravelobin Hess, PA-C at shift change. MDM   Final diagnoses:  Left flank pain   Patient presents today with left flank pain and hematuria.  Labs unremarkable.  UA significant for infection.  Urine cultured.  Patient given IV Rocephin in the ED.  CT negative for hydronephrosis or nephrolithiasis.  Patient signed out to Ailene Ravelobin Hess, PA-C at shift change.      Santiago GladHeather Excell Neyland, PA-C 08/18/14 1935  Toy CookeyMegan  Docherty, MD 08/19/14 1026

## 2014-08-17 NOTE — Discharge Instructions (Signed)
Take percocet for severe pain only. No driving or operating heavy machinery while taking percocet. This medication may cause drowsiness. Take keflex as prescribed for 1 week. It is very important to take the entire course of this antibiotic. Follow up with urology.  Pyelonephritis, Adult Pyelonephritis is a kidney infection. In general, there are 2 main types of pyelonephritis:  Infections that come on quickly without any warning (acute pyelonephritis).  Infections that persist for a long period of time (chronic pyelonephritis). CAUSES  Two main causes of pyelonephritis are:  Bacteria traveling from the bladder to the kidney. This is a problem especially in pregnant women. The urine in the bladder can become filled with bacteria from multiple causes, including:  Inflammation of the prostate gland (prostatitis).  Sexual intercourse in females.  Bladder infection (cystitis).  Bacteria traveling from the bloodstream to the tissue part of the kidney. Problems that may increase your risk of getting a kidney infection include:  Diabetes.  Kidney stones or bladder stones.  Cancer.  Catheters placed in the bladder.  Other abnormalities of the kidney or ureter. SYMPTOMS   Abdominal pain.  Pain in the side or flank area.  Fever.  Chills.  Upset stomach.  Blood in the urine (dark urine).  Frequent urination.  Strong or persistent urge to urinate.  Burning or stinging when urinating. DIAGNOSIS  Your caregiver may diagnose your kidney infection based on your symptoms. A urine sample may also be taken. TREATMENT  In general, treatment depends on how severe the infection is.   If the infection is mild and caught early, your caregiver may treat you with oral antibiotics and send you home.  If the infection is more severe, the bacteria may have gotten into the bloodstream. This will require intravenous (IV) antibiotics and a hospital stay. Symptoms may include:  High  fever.  Severe flank pain.  Shaking chills.  Even after a hospital stay, your caregiver may require you to be on oral antibiotics for a period of time.  Other treatments may be required depending upon the cause of the infection. HOME CARE INSTRUCTIONS   Take your antibiotics as directed. Finish them even if you start to feel better.  Make an appointment to have your urine checked to make sure the infection is gone.  Drink enough fluids to keep your urine clear or pale yellow.  Take medicines for the bladder if you have urgency and frequency of urination as directed by your caregiver. SEEK IMMEDIATE MEDICAL CARE IF:   You have a fever or persistent symptoms for more than 2-3 days.  You have a fever and your symptoms suddenly get worse.  You are unable to take your antibiotics or fluids.  You develop shaking chills.  You experience extreme weakness or fainting.  There is no improvement after 2 days of treatment. MAKE SURE YOU:  Understand these instructions.  Will watch your condition.  Will get help right away if you are not doing well or get worse. Document Released: 01/20/2005 Document Revised: 07/22/2011 Document Reviewed: 06/26/2010 [facility] Patient Information 2015 [place]. This information is not intended to replace advice given to you by your health care provider. Make sure you discuss any questions you have with your health care provider.  Urinary Tract Infection Urinary tract infections (UTIs) can develop anywhere along your urinary tract. Your urinary tract is your body's drainage system for removing wastes and extra water. Your urinary tract includes two kidneys, two ureters, a bladder, and a urethra. Your kidneys  are a pair of bean-shaped organs. Each kidney is about the size of your fist. They are located below your ribs, one on each side of your spine. CAUSES Infections are caused by microbes, which are microscopic organisms, including fungi, viruses,  and bacteria. These organisms are so small that they can only be seen through a microscope. Bacteria are the microbes that most commonly cause UTIs. SYMPTOMS  Symptoms of UTIs may vary by age and gender of the patient and by the location of the infection. Symptoms in young women typically include a frequent and intense urge to urinate and a painful, burning feeling in the bladder or urethra during urination. Older women and men are more likely to be tired, shaky, and weak and have muscle aches and abdominal pain. A fever may mean the infection is in your kidneys. Other symptoms of a kidney infection include pain in your back or sides below the ribs, nausea, and vomiting. DIAGNOSIS To diagnose a UTI, your caregiver will ask you about your symptoms. Your caregiver also will ask to provide a urine sample. The urine sample will be tested for bacteria and white blood cells. White blood cells are made by your body to help fight infection. TREATMENT  Typically, UTIs can be treated with medication. Because most UTIs are caused by a bacterial infection, they usually can be treated with the use of antibiotics. The choice of antibiotic and length of treatment depend on your symptoms and the type of bacteria causing your infection. HOME CARE INSTRUCTIONS  If you were prescribed antibiotics, take them exactly as your caregiver instructs you. Finish the medication even if you feel better after you have only taken some of the medication.  Drink enough water and fluids to keep your urine clear or pale yellow.  Avoid caffeine, tea, and carbonated beverages. They tend to irritate your bladder.  Empty your bladder often. Avoid holding urine for long periods of time.  Empty your bladder before and after sexual intercourse.  After a bowel movement, women should cleanse from front to back. Use each tissue only once. SEEK MEDICAL CARE IF:   You have back pain.  You develop a fever.  Your symptoms do not begin to  resolve within 3 days. SEEK IMMEDIATE MEDICAL CARE IF:   You have severe back pain or lower abdominal pain.  You develop chills.  You have nausea or vomiting.  You have continued burning or discomfort with urination. MAKE SURE YOU:   Understand these instructions.  Will watch your condition.  Will get help right away if you are not doing well or get worse. Document Released: 10/30/2004 Document Revised: 07/22/2011 Document Reviewed: 02/28/2011 [facility] Patient Information 2015 [place], [place]. This information is not intended to replace advice given to you by your health care provider. Make sure you discuss any questions you have with your health care provider.

## 2014-08-17 NOTE — ED Notes (Signed)
Pt to ct 

## 2014-08-17 NOTE — ED Notes (Signed)
Pt with acute onset LLQ and L side pain since this am. States urge to urinate with hematuria.  Pt appears in great pain.

## 2014-08-17 NOTE — ED Provider Notes (Signed)
Care [facility] Laisure, PA-C at shift change. Pt with L flank pain, CT negative for stone or hydronephrosis. UA significant for UTI. Given dilaudid and zofran. Dose of IV rocephin given. Plan to control pain and dispo appropriately.  5:51 PM Pt resting comfortably on exam bed. + L CVAT. Discussed results, offered admission for pain control and antibiotics, pt requests to go home as he would like to work tomorrow. He is non-toxic appearing and in NAD. I feel pt is well enough to be treated outpatient. Will d/c home with keflex, percocet and zofran, f/u with urology. Stable for d/c. Return precautions given. Patient states understanding of treatment care plan and is agreeable.  Discussed with attending Dr. Littie DeedsGentry who agrees with plan of care.   Raymond SpeedRobyn M Danyiel Crespin, PA-C 08/17/14 1752  Raymond MoMatthew Gentry, MD 08/18/14 [phone]

## 2014-08-17 NOTE — ED Notes (Signed)
Pt c/o lt flank and lt abd pain since 0930 this am with nausea.  This afternoon he was voiding blood dark in color

## 2014-08-17 NOTE — ED Notes (Signed)
Pt returned from c-t  Pain is no better or no worse

## 2014-08-19 LAB — URINE CULTURE: SPECIAL REQUESTS: NORMAL

## 2014-12-24 ENCOUNTER — Observation Stay (HOSPITAL_COMMUNITY)
Admission: EM | Admit: 2014-12-24 | Discharge: 2014-12-25 | Disposition: A | Discharge status: Home / Self Care | Payer: 59 | Attending: Urology | Admitting: Urology

## 2014-12-24 ENCOUNTER — Encounter (HOSPITAL_COMMUNITY)
Admission: EM | Disposition: A | Discharge status: Home / Self Care | Payer: Self-pay | Source: Home / Self Care | Attending: Emergency Medicine

## 2014-12-24 ENCOUNTER — Encounter (HOSPITAL_COMMUNITY): Payer: Self-pay

## 2014-12-24 ENCOUNTER — Emergency Department (HOSPITAL_COMMUNITY): Payer: 59

## 2014-12-24 ENCOUNTER — Emergency Department (HOSPITAL_COMMUNITY): Payer: 59 | Admitting: Anesthesiology

## 2014-12-24 ENCOUNTER — Observation Stay (HOSPITAL_COMMUNITY): Payer: 59

## 2014-12-24 DIAGNOSIS — N132 Hydronephrosis with renal and ureteral calculous obstruction: Principal | ICD-10-CM | POA: Insufficient documentation

## 2014-12-24 DIAGNOSIS — N179 Acute kidney failure, unspecified: Secondary | ICD-10-CM | POA: Diagnosis not present

## 2014-12-24 DIAGNOSIS — D72829 Elevated white blood cell count, unspecified: Secondary | ICD-10-CM | POA: Insufficient documentation

## 2014-12-24 DIAGNOSIS — F17210 Nicotine dependence, cigarettes, uncomplicated: Secondary | ICD-10-CM | POA: Insufficient documentation

## 2014-12-24 DIAGNOSIS — R8271 Bacteriuria: Secondary | ICD-10-CM | POA: Diagnosis not present

## 2014-12-24 DIAGNOSIS — N200 Calculus of kidney: Secondary | ICD-10-CM

## 2014-12-24 DIAGNOSIS — Z6841 Body Mass Index (BMI) 40.0 and over, adult: Secondary | ICD-10-CM | POA: Insufficient documentation

## 2014-12-24 DIAGNOSIS — N201 Calculus of ureter: Secondary | ICD-10-CM | POA: Diagnosis present

## 2014-12-24 HISTORY — PX: CYSTOSCOPY WITH URETEROSCOPY AND STENT PLACEMENT: SHX6377

## 2014-12-24 LAB — COMPREHENSIVE METABOLIC PANEL
ALBUMIN: 4.1 g/dL (ref 3.5–5.0)
ALT: 27 U/L (ref 17–63)
AST: 24 U/L (ref 15–41)
Alkaline Phosphatase: 78 U/L (ref 38–126)
Anion gap: 10 (ref 5–15)
BUN: 15 mg/dL (ref 6–20)
CHLORIDE: 105 mmol/L (ref 101–111)
CO2: 22 mmol/L (ref 22–32)
CREATININE: 1.69 mg/dL — AB (ref 0.61–1.24)
Calcium: 9.2 mg/dL (ref 8.9–10.3)
GFR calc Af Amer: >60 mL/min (ref >60)
GFR calc non Af Amer: 53 mL/min — ABNORMAL LOW (ref >60)
GLUCOSE: 100 mg/dL — AB (ref 65–99)
POTASSIUM: 3.7 mmol/L (ref 3.5–5.1)
SODIUM: 137 mmol/L (ref 135–145)
Total Bilirubin: 1 mg/dL (ref 0.3–1.2)
Total Protein: 8.2 g/dL — ABNORMAL HIGH (ref 6.5–8.1)

## 2014-12-24 LAB — CBC
HEMATOCRIT: 46.1 % (ref 39.0–52.0)
Hemoglobin: 16.3 g/dL (ref 13.0–17.0)
MCH: 30.5 pg (ref 26.0–34.0)
MCHC: 35.4 g/dL (ref 30.0–36.0)
MCV: 86.2 fL (ref 78.0–100.0)
Platelets: 282 10*3/uL (ref 150–400)
RBC: 5.35 MIL/uL (ref 4.22–5.81)
RDW: 13.3 % (ref 11.5–15.5)
WBC: 20.4 10*3/uL — AB (ref 4.0–10.5)

## 2014-12-24 LAB — URINE MICROSCOPIC-ADD ON

## 2014-12-24 LAB — URINALYSIS, ROUTINE W REFLEX MICROSCOPIC
Glucose, UA: NEGATIVE mg/dL
KETONES UR: NEGATIVE mg/dL
NITRITE: NEGATIVE
PH: 5.5 (ref 5.0–8.0)
Protein, ur: NEGATIVE mg/dL
Specific Gravity, Urine: 1.033 — ABNORMAL HIGH (ref 1.005–1.030)

## 2014-12-24 LAB — LIPASE, BLOOD: LIPASE: 27 U/L (ref 11–51)

## 2014-12-24 SURGERY — CYSTOURETEROSCOPY, WITH STENT INSERTION
Anesthesia: General | Site: Ureter | Laterality: Left

## 2014-12-24 MED ORDER — MIDAZOLAM HCL 2 MG/2ML IJ SOLN
INTRAMUSCULAR | Status: AC
  Filled 2014-12-24: qty 2

## 2014-12-24 MED ORDER — HYDROMORPHONE HCL 1 MG/ML IJ SOLN
1.0000 mg | Freq: Once | INTRAMUSCULAR | Status: AC
  Administered 2014-12-24: 1 mg via INTRAVENOUS
  Filled 2014-12-24: qty 1

## 2014-12-24 MED ORDER — HYDROMORPHONE HCL 1 MG/ML IJ SOLN
INTRAMUSCULAR | Status: AC
  Administered 2014-12-25
  Filled 2014-12-24: qty 1

## 2014-12-24 MED ORDER — MIDAZOLAM HCL 5 MG/5ML IJ SOLN
INTRAMUSCULAR | Status: DC | PRN
  Administered 2014-12-24: 2 mg via INTRAVENOUS

## 2014-12-24 MED ORDER — FENTANYL CITRATE (PF) 250 MCG/5ML IJ SOLN
INTRAMUSCULAR | Status: AC
  Filled 2014-12-24: qty 5

## 2014-12-24 MED ORDER — DEXTROSE 5 % IV SOLN
1.0000 g | Freq: Once | INTRAVENOUS | Status: AC
  Administered 2014-12-24: 1 g via INTRAVENOUS
  Filled 2014-12-24: qty 10

## 2014-12-24 MED ORDER — ONDANSETRON HCL 4 MG/2ML IJ SOLN
INTRAMUSCULAR | Status: DC | PRN
  Administered 2014-12-24: 4 mg via INTRAVENOUS

## 2014-12-24 MED ORDER — LIDOCAINE HCL (CARDIAC) 20 MG/ML IV SOLN
INTRAVENOUS | Status: AC
  Filled 2014-12-24: qty 5

## 2014-12-24 MED ORDER — ONDANSETRON 4 MG PO TBDP: 4.0000 mg | ORAL_TABLET | ORAL | Status: DC | PRN

## 2014-12-24 MED ORDER — LIDOCAINE HCL (CARDIAC) 20 MG/ML IV SOLN
INTRAVENOUS | Status: DC | PRN
  Administered 2014-12-24: 100 mg via INTRAVENOUS

## 2014-12-24 MED ORDER — PROPOFOL 10 MG/ML IV BOLUS
INTRAVENOUS | Status: DC | PRN
  Administered 2014-12-24: 400 mg via INTRAVENOUS

## 2014-12-24 MED ORDER — SUCCINYLCHOLINE CHLORIDE 20 MG/ML IJ SOLN
INTRAMUSCULAR | Status: AC
  Filled 2014-12-24: qty 1

## 2014-12-24 MED ORDER — FENTANYL CITRATE (PF) 250 MCG/5ML IJ SOLN
INTRAMUSCULAR | Status: DC | PRN
  Administered 2014-12-24: 100 ug via INTRAVENOUS
  Administered 2014-12-24 (×3): 50 ug via INTRAVENOUS

## 2014-12-24 MED ORDER — SODIUM CHLORIDE 0.9 % IV SOLN
INTRAVENOUS | Status: DC
  Administered 2014-12-24 – 2014-12-25 (×2): via INTRAVENOUS
  Administered 2014-12-25: 1 mL via INTRAVENOUS

## 2014-12-24 MED ORDER — OXYCODONE HCL 5 MG PO TABS: 5.0000 mg | ORAL_TABLET | Freq: Once | ORAL | Status: DC | PRN

## 2014-12-24 MED ORDER — SODIUM CHLORIDE 0.9 % IV SOLN
1000.0000 mL | Freq: Once | INTRAVENOUS | Status: AC
  Administered 2014-12-24: 1000 mL via INTRAVENOUS

## 2014-12-24 MED ORDER — HYDROMORPHONE HCL 1 MG/ML IJ SOLN
0.2500 mg | INTRAMUSCULAR | Status: DC | PRN
  Administered 2014-12-24: 0.5 mg via INTRAVENOUS

## 2014-12-24 MED ORDER — ONDANSETRON HCL 4 MG/2ML IJ SOLN
4.0000 mg | Freq: Once | INTRAMUSCULAR | Status: AC
  Administered 2014-12-24: 4 mg via INTRAVENOUS
  Filled 2014-12-24: qty 2

## 2014-12-24 MED ORDER — OXYCODONE HCL 5 MG/5ML PO SOLN: 5.0000 mg | Freq: Once | ORAL | Status: DC | PRN

## 2014-12-24 MED ORDER — CIPROFLOXACIN IN D5W 400 MG/200ML IV SOLN
INTRAVENOUS | Status: DC | PRN
  Administered 2014-12-24: 400 mg via INTRAVENOUS

## 2014-12-24 MED ORDER — IOHEXOL 300 MG/ML  SOLN
INTRAMUSCULAR | Status: DC | PRN
  Administered 2014-12-24: 5 mL via INTRAVENOUS

## 2014-12-24 MED ORDER — SODIUM CHLORIDE 0.9 % IV SOLN
INTRAVENOUS | Status: DC
  Administered 2014-12-24: 17:00:00 via INTRAVENOUS

## 2014-12-24 MED ORDER — LACTATED RINGERS IV SOLN
INTRAVENOUS | Status: DC | PRN
  Administered 2014-12-24: 23:00:00 via INTRAVENOUS

## 2014-12-24 MED ORDER — STERILE WATER FOR IRRIGATION IR SOLN
Status: DC | PRN
  Administered 2014-12-24: 1000 mL via INTRAVESICAL

## 2014-12-24 MED ORDER — PROPOFOL 10 MG/ML IV BOLUS
INTRAVENOUS | Status: AC
  Filled 2014-12-24: qty 20

## 2014-12-24 MED ORDER — IBUPROFEN 800 MG PO TABS: 800.0000 mg | ORAL_TABLET | Freq: Three times a day (TID) | ORAL | Status: DC

## 2014-12-24 MED ORDER — OXYCODONE-ACETAMINOPHEN 5-325 MG PO TABS: 2.0000 | ORAL_TABLET | ORAL | Status: DC | PRN

## 2014-12-24 MED ORDER — MEPERIDINE HCL 25 MG/ML IJ SOLN: 6.2500 mg | INTRAMUSCULAR | Status: DC | PRN

## 2014-12-24 MED ORDER — KETOROLAC TROMETHAMINE 30 MG/ML IJ SOLN
30.0000 mg | Freq: Once | INTRAMUSCULAR | Status: AC
  Administered 2014-12-24: 30 mg via INTRAVENOUS
  Filled 2014-12-24: qty 1

## 2014-12-24 MED ORDER — AMOXICILLIN-POT CLAVULANATE 875-125 MG PO TABS: 1.0000 | ORAL_TABLET | Freq: Two times a day (BID) | ORAL | Status: DC

## 2014-12-24 MED ORDER — SODIUM CHLORIDE 0.9 % IV SOLN
1000.0000 mL | INTRAVENOUS | Status: DC
  Administered 2014-12-24: 1000 mL via INTRAVENOUS

## 2014-12-24 SURGICAL SUPPLY — 30 items
ADAPTER CATH URET PLST 4-6FR (CATHETERS) ×3 IMPLANT
BAG URINE DRAINAGE (UROLOGICAL SUPPLIES) ×3 IMPLANT
BAG URO CATCHER STRL LF (DRAPE) ×3 IMPLANT
BENZOIN TINCTURE PRP APPL 2/3 (GAUZE/BANDAGES/DRESSINGS) IMPLANT
BLADE 10 SAFETY STRL DISP (BLADE) IMPLANT
BUCKET BIOHAZARD WASTE 5 GAL (MISCELLANEOUS) ×3 IMPLANT
CATH FOLEY 2WAY SLVR  5CC 16FR (CATHETERS) ×2
CATH FOLEY 2WAY SLVR 5CC 16FR (CATHETERS) ×1 IMPLANT
CATH URET 5FR 28IN CONE TIP (BALLOONS)
CATH URET 5FR 28IN OPEN ENDED (CATHETERS) ×3 IMPLANT
CATH URET 5FR 70CM CONE TIP (BALLOONS) IMPLANT
DRAPE CAMERA CLOSED 9X96 (DRAPES) IMPLANT
GLOVE ORTHO TXT STRL SZ7.5 (GLOVE) ×3 IMPLANT
GOWN STRL REUS W/ TWL LRG LVL3 (GOWN DISPOSABLE) ×1 IMPLANT
GOWN STRL REUS W/TWL LRG LVL3 (GOWN DISPOSABLE) ×2
GUIDEWIRE ANG ZIPWIRE 038X150 (WIRE) ×3 IMPLANT
GUIDEWIRE COOK  .035 (WIRE) IMPLANT
GUIDEWIRE STR DUAL SENSOR (WIRE) IMPLANT
KIT ROOM TURNOVER OR (KITS) ×3 IMPLANT
NS IRRIG 1000ML POUR BTL (IV SOLUTION) ×6 IMPLANT
PACK CYSTOSCOPY (CUSTOM PROCEDURE TRAY) ×3 IMPLANT
PAD ARMBOARD 7.5X6 YLW CONV (MISCELLANEOUS) ×6 IMPLANT
PLUG CATH AND CAP STER (CATHETERS) IMPLANT
STENT CONTOUR 6FRX26X.035 (STENTS) ×3 IMPLANT
STENT URET 6FRX24 CONTOUR (STENTS) IMPLANT
SYR CONTROL 10ML LL (SYRINGE) ×3 IMPLANT
SYRINGE TOOMEY DISP (SYRINGE) IMPLANT
UNDERPAD 30X30 INCONTINENT (UNDERPADS AND DIAPERS) IMPLANT
WATER STERILE IRR 1000ML POUR (IV SOLUTION) IMPLANT
WIRE COONS/BENSON .038X145CM (WIRE) IMPLANT

## 2014-12-24 NOTE — ED Provider Notes (Signed)
  Physical Exam  BP 136/85 mmHg  Pulse 66  Temp(Src) 98.3 F (36.8 C) (Oral)  Resp 20  Ht 5\' 11"  (1.803 m)  Wt 335 lb (151.955 kg)  BMI 46.74 kg/m2  SpO2 99%  Physical Exam  ED Course  Procedures  MDM Patient's care assumed at sign out. Patient has infected kidney stone. Decided to stay for stent. Dr. Berneice HeinrichManny to take patient to OR for stent placement.   Richardean Canalavid H Jackob [place], MD 12/24/14 [phone]

## 2014-12-24 NOTE — Discharge Instructions (Signed)
Kidney Stones °Kidney stones (urolithiasis) are deposits that form inside your kidneys. The intense pain is caused by the stone moving through the urinary tract. When the stone moves, the ureter goes into spasm around the stone. The stone is usually passed in the urine.  °CAUSES  °· A disorder that makes certain neck glands produce too much parathyroid hormone (primary hyperparathyroidism). °· A buildup of uric acid crystals, similar to gout in your joints. °· Narrowing (stricture) of the ureter. °· A kidney obstruction present at birth (congenital obstruction). °· Previous surgery on the kidney or ureters. °· Numerous kidney infections. °SYMPTOMS  °· Feeling sick to your stomach (nauseous). °· Throwing up (vomiting). °· Blood in the urine (hematuria). °· Pain that usually spreads (radiates) to the groin. °· Frequency or urgency of urination. °DIAGNOSIS  °· Taking a history and physical exam. °· Blood or urine tests. °· CT scan. °· Occasionally, an examination of the inside of the urinary bladder (cystoscopy) is performed. °TREATMENT  °· Observation. °· Increasing your fluid intake. °· Extracorporeal shock wave lithotripsy--This is a noninvasive procedure that uses shock waves to break up kidney stones. °· Surgery may be needed if you have severe pain or persistent obstruction. There are various surgical procedures. Most of the procedures are performed with the use of small instruments. Only small incisions are needed to accommodate these instruments, so recovery time is minimized. °The size, location, and chemical composition are all important variables that will determine the proper choice of action for you. Talk to your health care provider to better understand your situation so that you will minimize the risk of injury to yourself and your kidney.  °HOME CARE INSTRUCTIONS  °· Drink enough water and fluids to keep your urine clear or pale yellow. This will help you to pass the stone or stone fragments. °· Strain  all urine through the provided strainer. Keep all particulate matter and stones for your health care provider to see. The stone causing the pain may be as small as a grain of salt. It is very important to use the strainer each and every time you pass your urine. The collection of your stone will allow your health care provider to analyze it and verify that a stone has actually passed. The stone analysis will often identify what you can do to reduce the incidence of recurrences. °· Only take over-the-counter or prescription medicines for pain, discomfort, or fever as directed by your health care provider. °· Keep all follow-up visits as told by your health care provider. This is important. °· Get follow-up X-rays if required. The absence of pain does not always mean that the stone has passed. It may have only stopped moving. If the urine remains completely obstructed, it can cause loss of kidney function or even complete destruction of the kidney. It is your responsibility to make sure X-rays and follow-ups are completed. Ultrasounds of the kidney can show blockages and the status of the kidney. Ultrasounds are not associated with any radiation and can be performed easily in a matter of minutes. °· Make changes to your daily diet as told by your health care provider. You may be told to: °¨ Limit the amount of salt that you eat. °¨ Eat 5 or more servings of fruits and vegetables each day. °¨ Limit the amount of meat, poultry, fish, and eggs that you eat. °· Collect a 24-hour urine sample as told by your health care provider. You may need to collect another urine sample every 6-12   months. °SEEK MEDICAL CARE IF: °· You experience pain that is progressive and unresponsive to any pain medicine you have been prescribed. °SEEK IMMEDIATE MEDICAL CARE IF:  °· Pain cannot be controlled with the prescribed medicine. °· You have a fever or shaking chills. °· The severity or intensity of pain increases over 18 hours and is not  relieved by pain medicine. °· You develop a new onset of abdominal pain. °· You feel faint or pass out. °· You are unable to urinate. °  °This information is not intended to replace advice given to you by your health care provider. Make sure you discuss any questions you have with your health care provider. °  °Document Released: 01/20/2005 Document Revised: 10/11/2014 Document Reviewed: 06/23/2012 °Elsevier Interactive Patient Education ©2016 [employer]vier Inc. ° °

## 2014-12-24 NOTE — ED Provider Notes (Signed)
CSN: [id number]     Arrival date & time 12/24/14  1208 History   First MD Initiated Contact with Patient 12/24/14 1246     Chief Complaint  Patient presents with  . Flank Pain     (Consider location/radiation/quality/duration/timing/severity/associated sxs/prior Treatment) HPI 3 PM yesterday the patient develops severe left flank pain. He states that it is sharp and radiates around to his left mid abdomen. He reports he has had nausea and vomiting with it. He described to me that the vomit was bright yellow. In triage note it indicates brown emesis. He denies pain or burning with urination. He does however report this is similar to symptoms he had when he had a urinary tract infection. History reviewed. No pertinent past medical history. Past Surgical History  Procedure Laterality Date  . Cystoscopy with ureteroscopy and stent placement Left 12/24/2014    Procedure: CYSTOSCOPY WITH URETEROSCOPY AND STENT PLACEMENT;  Surgeon: Jerilee Field, MD;  Location: [facility] OR;  Service: Urology;  Laterality: Left;   History reviewed. No pertinent family history. Social History  Substance Use Topics  . Smoking status: Current Every Day Smoker -- 0.50 packs/day    Types: Cigarettes  . Smokeless tobacco: None  . Alcohol Use: No    Review of Systems  10 Systems reviewed and are negative for acute change except as noted in the HPI.   Allergies  Demerol  Home Medications   Prior to Admission medications   Medication Sig Start Date End Date Taking? Authorizing Provider  amoxicillin-clavulanate (AUGMENTIN) 875-125 MG tablet Take 1 tablet by mouth 2 (two) times daily. One po bid x 7 days 12/24/14   Arby Barrette, MD  ibuprofen (ADVIL,MOTRIN) 800 MG tablet Take 1 tablet (800 mg total) by mouth 3 (three) times daily. 12/24/14   Arby Barrette, MD  ondansetron (ZOFRAN ODT) 4 MG disintegrating tablet Take 1 tablet (4 mg total) by mouth every 4 (four) hours as needed for nausea or vomiting. 12/24/14    Arby Barrette, MD  oxyCODONE-acetaminophen (PERCOCET) 5-325 MG tablet Take 2 tablets by mouth every 6 (six) hours as needed for moderate pain. Post-operatively 12/25/14   Sebastian Ache, MD  senna-docusate (SENOKOT-S) 8.6-50 MG tablet Take 1 tablet by mouth 2 (two) times daily. While taking pain meds to prevent constipation 12/25/14   Sebastian Ache, MD   BP 149/92 mmHg  Pulse 67  Temp(Src) 98.4 F (36.9 C) (Oral)  Resp 18  Ht  (1.803 m)  Wt 345 lb 11.2 oz (156.808 kg)  BMI 48.24 kg/m2  SpO2 98% Physical Exam  Constitutional: He is oriented to person, place, and time.  Morbidly obese. Patient is nontoxic alert. He is sitting at the edge of the stretcher looking at his phone. No immediate distress noted.  HENT:  Head: Normocephalic and atraumatic.  Eyes: EOM are normal. Pupils are equal, round, and reactive to light.  Neck: Neck supple.  Cardiovascular: Normal rate, regular rhythm, normal heart sounds and intact distal pulses.   Pulmonary/Chest: Effort normal and breath sounds normal.  Abdominal: Soft. Bowel sounds are normal. He exhibits no distension. There is tenderness.  Patient endorses left lateral abdominal tenderness to palpation no guarding. Positive CVA tenderness on the left  Musculoskeletal: Normal range of motion. He exhibits no edema.  Neurological: He is alert and oriented to person, place, and time. He has normal strength. Coordination normal. GCS eye subscore is 4. GCS verbal subscore is 5. GCS motor subscore is 6.  Skin: Skin is warm, dry and  intact.  Psychiatric: He has a normal mood and affect.    ED Course  Procedures (including critical care time) Labs Review Labs Reviewed  COMPREHENSIVE METABOLIC PANEL - Abnormal; Notable for the following:    Glucose, Bld 100 (*)    Creatinine, Ser 1.69 (*)    Total Protein 8.2 (*)    GFR calc non Af Amer 53 (*)    All other components within normal limits  CBC - Abnormal; Notable for the following:    WBC 20.4  (*)    All other components within normal limits  URINALYSIS, ROUTINE W REFLEX MICROSCOPIC (NOT [facility]) - Abnormal; Notable for the following:    Color, Urine AMBER (*)    APPearance HAZY (*)    Specific Gravity, Urine 1.033 (*)    Hgb urine dipstick LARGE (*)    Bilirubin Urine SMALL (*)    Leukocytes, UA SMALL (*)    All other components within normal limits  URINE MICROSCOPIC-ADD ON - Abnormal; Notable for the following:    Squamous Epithelial / LPF 0-5 (*)    Bacteria, UA FEW (*)    All other components within normal limits  CBC WITH DIFFERENTIAL/PLATELET - Abnormal; Notable for the following:    WBC 12.6 (*)    Neutro Abs 8.4 (*)    All other components within normal limits  BASIC METABOLIC PANEL - Abnormal; Notable for the following:    Creatinine, Ser 1.30 (*)    Calcium 8.1 (*)    All other components within normal limits  CULTURE, BLOOD (ROUTINE X 2)  CULTURE, BLOOD (ROUTINE X 2)  URINE CULTURE  LIPASE, BLOOD    Imaging Review No results found. I have personally reviewed and evaluated these images and lab results as part of my medical decision-making.   EKG Interpretation None     Consult: Patient case reviewed with Dr. Berneice HeinrichManny. Advises best management will be for the patient to stay inpatient and get a stent placed. If patient determined as not to stay, [facility] if he gets fever or any worsening condition. Wise he is to call for outpatient follow-up. MDM   Final diagnoses:  Kidney stone   Patient is concerned he will lose his job if he stays in the hospital overnight he reports he must be worked for morning. He is counseled on risks of leaving without stent placement.      Arby BarretteMarcy Salvator Seppala, MD 01/03/15 [phone]

## 2014-12-24 NOTE — Progress Notes (Signed)
  Pt without complaint.  I reviewed charts, notes, labs, CT images.  Discussed with Dr. Berneice HeinrichManny and OR team. Site marked, consent signed.   Pt stable for procedure. A&Ox3. Ambulated to bathroom.   I discussed with the patient the nature, potential benefits, risks and alternatives to cystoscopy, left RGP, left ureteral stent, including side effects of the proposed treatment, the likelihood of the patient achieving the goals of the procedure, and any potential problems that might occur during the procedure or recuperation. Discused rationale for staged procedure and temporary nature of stents -- importance of f/u. Discussed a;ternatives such as medical tx and left PC Nx. All questions answered. Patient elects to proceed.

## 2014-12-24 NOTE — Anesthesia Preprocedure Evaluation (Addendum)
Anesthesia Evaluation  Patient identified by MRN, date of birth, ID band Patient awake    Reviewed: Allergy & Precautions, NPO status , Patient's Chart, lab work & pertinent test results  Airway Mallampati: I  TM Distance: >3 FB Neck ROM: Full    Dental  (+) Teeth Intact, Dental Advisory Given   Pulmonary Current Smoker,    breath sounds clear to auscultation       Cardiovascular  Rhythm:Regular Rate:Normal     Neuro/Psych    GI/Hepatic   Endo/Other  Morbid obesity  Renal/GU      Musculoskeletal   Abdominal   Peds  Hematology   Anesthesia Other Findings   Reproductive/Obstetrics                            Anesthesia Physical Anesthesia Plan  ASA: II and emergent  Anesthesia Plan: General   Post-op Pain Management:    Induction: Intravenous  Airway Management Planned: Oral ETT  Additional Equipment:   Intra-op Plan:   Post-operative Plan: Extubation in OR  Informed Consent: I have reviewed the patients History and Physical, chart, labs and discussed the procedure including the risks, benefits and alternatives for the proposed anesthesia with the patient or authorized representative who has indicated his/her understanding and acceptance.   Dental advisory given  Plan Discussed with: CRNA, Anesthesiologist and Surgeon  Anesthesia Plan Comments:         Anesthesia Quick Evaluation

## 2014-12-24 NOTE — Anesthesia Procedure Notes (Signed)
Procedure Name: LMA Insertion Date/Time: 12/24/2014 10:50 PM Performed by: Brien MatesMAHONY, Kelyn Ponciano D Pre-anesthesia Checklist: Patient identified, Emergency Drugs available, Suction available, Patient being monitored and Timeout performed Patient Re-evaluated:Patient Re-evaluated prior to inductionOxygen Delivery Method: Circle system utilized Preoxygenation: Pre-oxygenation with 100% oxygen Intubation Type: IV induction Ventilation: Mask ventilation without difficulty LMA: LMA inserted LMA Size: 5.0 Number of attempts: 1 Placement Confirmation: positive ETCO2 and breath sounds checked- equal and bilateral Tube secured with: Tape Dental Injury: Teeth and Oropharynx as per pre-operative assessment

## 2014-12-24 NOTE — Transfer of Care (Signed)
Immediate Anesthesia Transfer of Care Note  Patient: Raymond RobertsonDaniel Chapman  Procedure(s) Performed: Procedure(s): CYSTOSCOPY WITH URETEROSCOPY AND STENT PLACEMENT (Left)  Patient Location: PACU  Anesthesia Type:General  Level of Consciousness: awake  Airway & Oxygen Therapy: Patient Spontanous Breathing and Patient connected to face mask oxygen  Post-op Assessment: Report given to RN and Post -op Vital signs reviewed and stable  Post vital signs: Reviewed and stable  Last Vitals:  Filed Vitals:   12/24/14 1515 12/24/14 1930  BP: 136/85 127/82  Pulse: 66 70  Temp: 36.8 C   Resp: 20 18    Complications: No apparent anesthesia complications

## 2014-12-24 NOTE — Op Note (Signed)
Preoperative diagnosis: Left ureteral stone, urinary tract infection Postoperative diagnosis: Same  Procedure: Cystoscopy, left retrograde pyelogram, left ureteral stent placement, Foley catheter placement  Surgeon: Halona Amstutz  Anesthesia: Gen.  Indication for procedure: 29 year old with left proximal ureteral stone, leukocytosis, subjective fevers and bacteriuria concerning for obstructing pyelonephritis with a 6 mm left proximal stone. He was brought for urgent left ureteral stent placement.  Findings: On cystoscopy the urethra at quite a bit of squamous metaplastic changes along the urethra but there were no strictures. The bladder contained no stone or foreign body. The ureteral orifices and trigone were in their normal orthotopic position.  Left retrograde pyelogram-this outlined a single ureter single collecting system unit with filling defect in the left proximal ureter consistent with the ureteral stone. Mild dilation and collecting system dilation. Stent coil in good position and renal pelvis after reconstituted.  Description of procedure: After consent was obtained patient brought to the operating room. After adequate anesthesia he is placed in lithotomy position and prepped and draped in the usual sterile fashion. The cystoscope was passed per urethra the left ureteral orifice visualized. A 5 French open-ended catheter was advanced into the left distal ureter and retrograde injection of contrast was performed. No 38 Glidewire was then advanced and coiled in the upper pole collecting system. [facility] x 26 cm ureteral stent was advanced. The stone was quite impacted. The stent was in position and the wire removed. A good coil was noted in the renal pelvis and a good coil in the bladder. Copious amounts of dark purulent urine were forcefully extruding out of the stent holes. I decided to place a 6416 French Foley catheter to max drain the kidney and bladder. It was left gravity  drainage. The patient was awakened and taken to recovery room in stable condition.  Complications: None Blood loss: None Specimens: None  Drains: 6 x 26 cm left ureteral stent

## 2014-12-24 NOTE — ED Notes (Signed)
Onset yesterday 3am left flank, fevers, vomiting x 4-5-brown emesis.  Pt yelling out in pain, unable to sit down.

## 2014-12-24 NOTE — H&P (Signed)
Raymond Chapman is an 29 y.o. male.    Chief Complaint: Left Ureteral Stone, Acute Renal Failure, Bacteruria  HPI:   1 - Left Ureteral Stone - left 80m prox ureteral stone with mild hydro by ER CT 12/24/14 on eval left flank pain, fevers, malaise, nasuea / emesis. Stone 626m SSD 20cm, 350HU. NO additional stones  2 - Acute Renal Failure - Cr 1.7 by ER labs up from baseline <1. Admits to poor PO intake with emesis. Did receive ketorolac in ER.  3 - Bacteruria, Leukocytosis - WBC 20k, subjective fevers, bacteruria on UA worriseom for obstructed pyelonephristis. Recieved Rocephin in ER. UCX, BCX obtained in ER and pending.   PMH sig for morbid obesity. No prior surgeries. No CV disease.    Today " Raymond Chapman " is seen as urgent admit for above. Last meal yesterday.   History reviewed. No pertinent past medical history. History reviewed. No pertinent past surgical history.  History reviewed. No pertinent family history. Social History:  reports that he has been smoking Cigarettes.  He has been smoking about 0.50 packs per day. He does not have any smokeless tobacco history on file. He reports that he does not drink alcohol or use illicit drugs.  Allergies:  Allergies  Allergen Reactions  . Demerol [Meperidine] Other (See Comments)    hyperactivity     (Not in a hospital admission)  Results for orders placed or performed during the hospital encounter of 12/24/14 (from the past 48 hour(s))  Lipase, blood     Status: None   Collection Time: 12/24/14 12:35 PM  Result Value Ref Range   Lipase 27 11 - 51 U/L  Comprehensive metabolic panel     Status: Abnormal   Collection Time: 12/24/14 12:35 PM  Result Value Ref Range   Sodium 137 135 - 145 mmol/L   Potassium 3.7 3.5 - 5.1 mmol/L   Chloride 105 101 - 111 mmol/L   CO2 22 22 - 32 mmol/L   Glucose, Bld 100 (H) 65 - 99 mg/dL   BUN 15 6 - 20 mg/dL   Creatinine, Ser 1.69 (H) 0.61 - 1.24 mg/dL   Calcium 9.2 8.9 - 10.3 mg/dL   Total Protein 8.2  (H) 6.5 - 8.1 g/dL   Albumin 4.1 3.5 - 5.0 g/dL   AST 24 15 - 41 U/L   ALT 27 17 - 63 U/L   Alkaline Phosphatase 78 38 - 126 U/L   Total Bilirubin 1.0 0.3 - 1.2 mg/dL   GFR calc non Af Amer 53 (L) >60 mL/min   GFR calc Af Amer >60 >60 mL/min    Comment: (NOTE) The eGFR has been calculated using the CKD EPI equation. This calculation has not been validated in all clinical situations. eGFR's persistently <60 mL/min signify possible Chronic Kidney Disease.    Anion gap 10 5 - 15  CBC     Status: Abnormal   Collection Time: 12/24/14 12:35 PM  Result Value Ref Range   WBC 20.4 (H) 4.0 - 10.5 K/uL   RBC 5.35 4.22 - 5.81 MIL/uL   Hemoglobin 16.3 13.0 - 17.0 g/dL   HCT 46.1 39.0 - 52.0 %   MCV 86.2 78.0 - 100.0 fL   MCH 30.5 26.0 - 34.0 pg   MCHC 35.4 30.0 - 36.0 g/dL   RDW 13.3 11.5 - 15.5 %   Platelets 282 150 - 400 K/uL  Urinalysis, [facility] reflex microscopic [facility]    Status: Abnormal  Collection Time: 12/24/14  4:03 PM  Result Value Ref Range   Color, Urine AMBER (A) YELLOW    Comment: BIOCHEMICALS MAY BE AFFECTED BY COLOR   APPearance HAZY (A) CLEAR   Specific Gravity, Urine 1.033 (H) 1.005 - 1.030   pH 5.5 5.0 - 8.0   Glucose, UA NEGATIVE NEGATIVE mg/dL   Hgb urine dipstick LARGE (A) NEGATIVE   Bilirubin Urine SMALL (A) NEGATIVE   Ketones, ur NEGATIVE NEGATIVE mg/dL   Protein, ur NEGATIVE NEGATIVE mg/dL   Nitrite NEGATIVE NEGATIVE   Leukocytes, UA SMALL (A) NEGATIVE  Urine microscopic-add on     Status: Abnormal   Collection Time: 12/24/14  4:03 PM  Result Value Ref Range   Squamous Epithelial / LPF 0-5 (A) NONE SEEN    Comment: Please note change in reference range.   WBC, UA 6-30 0 - 5 WBC/hpf    Comment: Please note change in reference range.   RBC / HPF 6-30 0 - 5 RBC/hpf    Comment: Please note change in reference range.   Bacteria, UA FEW (A) NONE SEEN    Comment: Please note change in reference range.   Urine-Other MUCOUS PRESENT    Ct Renal  Stone Study  12/24/2014  CLINICAL DATA:  Patient with left flank pain, nausea and vomiting. EXAM: CT ABDOMEN AND PELVIS WITHOUT CONTRAST TECHNIQUE: Multidetector CT imaging of the abdomen and pelvis was performed following the standard protocol without IV contrast. COMPARISON:  CT abdomen pelvis 08/17/2014. FINDINGS: Lower chest: Normal heart size. No consolidative or nodular pulmonary opacities. No pleural effusion. Hepatobiliary: Liver is normal in size and contour. Liver parenchyma is diffusely low in attenuation. Layering high density within the gallbladder lumen most suggestive of sludge. No gallbladder wall thickening. No intrahepatic or extrahepatic biliary ductal dilatation. Pancreas: Unremarkable Spleen: Unremarkable Adrenals/Urinary Tract: The adrenal glands are normal. There is moderate left hydroureteronephrosis to the proximal left ureter where there is an obstructing 6 mm stone (image 54; series 2). The left kidney is enlarged and there is perinephric fat stranding. Urinary bladder is unremarkable. Stomach/Bowel: No abnormal bowel wall thickening or evidence for bowel obstruction. Normal appendix. Vascular/Lymphatic: Normal caliber abdominal aorta. Multiple prominent sub cm retroperitoneal lymph nodes are stable. Other: Prostate is unremarkable. Multiple phleboliths within the pelvis. Musculoskeletal: Lower thoracic and lumbar spine degenerative changes. Multilevel compression deformities, stable from prior. No aggressive or acute appearing osseous lesions. IMPRESSION: There is an obstructing 6 mm stone within the proximal left ureter resulting in moderate left hydroureteronephrosis. Layering sludge within the gallbladder lumen. Hepatic steatosis. Electronically Signed   By: Raymond Chapman M.D.   On: 12/24/2014 15:47    Review of Systems  Constitutional: Positive for fever and chills. Negative for weight loss.  HENT: Negative.   Eyes: Negative.   Respiratory: Negative.   Cardiovascular:  Negative.   Gastrointestinal: Negative.   Genitourinary: Positive for flank pain.  Musculoskeletal: Negative.   Skin: Negative.   Neurological: Negative.   Endo/Heme/Allergies: Negative.   Psychiatric/Behavioral: Negative.     Blood pressure 136/85, pulse 66, temperature 98.3 F (36.8 C), temperature source Oral, resp. rate 20, height _0  (1.803 m), weight 151.955 kg (335 lb), SpO2 99 %. Physical Exam  Constitutional: He appears well-developed.  HENT:  Head: Normocephalic.  Eyes: Pupils are equal, round, and reactive to light.  Neck: Normal range of motion.  Cardiovascular: Normal rate.   Respiratory: Effort normal.  GI: Soft.  Morbid truncal obesity  Genitourinary:  Severe left CVAT  Musculoskeletal: Normal range of motion.  Neurological: He is alert.  Skin: Skin is warm.  Psychiatric: He has a normal mood and affect. His behavior is normal. Judgment and thought content normal.     Assessment/Plan  1 - Left Ureteral Stone - rec urgent decompression. Options of stent v. neph tube discussed and he wants to proceed with stent. Risks, benefits, alternatives discussed as well as fact will requrie staged therapy with renal decompression followed by definitive stone surgery (ureteroscopy) in few weeks after clears infectious paramaters as treating stone in setting in infectious would be hazardous.   I will have my collegue Dr. Junious Silk perform procedure as he will be on call at time of surgery.   Admit post-op for continued IV ABX until verified afebrile x 24 hrs and prelim BCX negative.   2 - Acute Renal Failure - likely comvination left renal obstruction with pre-renal dehydration in setting of emesis and poor PO intake. IVF and left ureteral stent as per above.   3 - Bacteruria, Leukocytosis - UCX, BCX, Empiric rocephin for preseumed early pyelo / urosepsis.     Kalani Baray 12/24/2014, 5:38 PM

## 2014-12-25 ENCOUNTER — Encounter (HOSPITAL_COMMUNITY): Payer: Self-pay | Admitting: Urology

## 2014-12-25 LAB — CBC WITH DIFFERENTIAL/PLATELET
Basophils Absolute: 0 10*3/uL (ref 0.0–0.1)
Basophils Relative: 0 %
Eosinophils Absolute: 0.2 10*3/uL (ref 0.0–0.7)
Eosinophils Relative: 2 %
HEMATOCRIT: 40 % (ref 39.0–52.0)
HEMOGLOBIN: 13.4 g/dL (ref 13.0–17.0)
LYMPHS ABS: 3.2 10*3/uL (ref 0.7–4.0)
LYMPHS PCT: 26 %
MCH: 29.4 pg (ref 26.0–34.0)
MCHC: 33.5 g/dL (ref 30.0–36.0)
MCV: 87.7 fL (ref 78.0–100.0)
MONOS PCT: 6 %
Monocytes Absolute: 0.7 10*3/uL (ref 0.1–1.0)
NEUTROS PCT: 66 %
Neutro Abs: 8.4 10*3/uL — ABNORMAL HIGH (ref 1.7–7.7)
Platelets: 230 10*3/uL (ref 150–400)
RBC: 4.56 MIL/uL (ref 4.22–5.81)
RDW: 13.5 % (ref 11.5–15.5)
WBC: 12.6 10*3/uL — AB (ref 4.0–10.5)

## 2014-12-25 LAB — BASIC METABOLIC PANEL
Anion gap: 8 (ref 5–15)
BUN: 14 mg/dL (ref 6–20)
CHLORIDE: 105 mmol/L (ref 101–111)
CO2: 25 mmol/L (ref 22–32)
CREATININE: 1.3 mg/dL — AB (ref 0.61–1.24)
Calcium: 8.1 mg/dL — ABNORMAL LOW (ref 8.9–10.3)
GFR calc Af Amer: >60 mL/min (ref >60)
GFR calc non Af Amer: >60 mL/min (ref >60)
GLUCOSE: 93 mg/dL (ref 65–99)
POTASSIUM: 3.5 mmol/L (ref 3.5–5.1)
Sodium: 138 mmol/L (ref 135–145)

## 2014-12-25 LAB — URINE CULTURE: CULTURE: NO GROWTH

## 2014-12-25 MED ORDER — ONDANSETRON HCL 4 MG/2ML IJ SOLN
4.0000 mg | INTRAMUSCULAR | Status: DC | PRN
Start: 2014-12-25 — End: 2014-12-25

## 2014-12-25 MED ORDER — DEXTROSE 5 % IV SOLN
1.0000 g | INTRAVENOUS | Status: DC
  Administered 2014-12-25: 1 g via INTRAVENOUS
  Filled 2014-12-25: qty 10

## 2014-12-25 MED ORDER — SENNOSIDES-DOCUSATE SODIUM 8.6-50 MG PO TABS: 1.0000 | ORAL_TABLET | Freq: Two times a day (BID) | ORAL | Status: DC

## 2014-12-25 MED ORDER — OXYCODONE-ACETAMINOPHEN 5-325 MG PO TABS
1.0000 | ORAL_TABLET | ORAL | Status: DC | PRN
  Filled 2014-12-25: qty 1

## 2014-12-25 MED ORDER — ACETAMINOPHEN 325 MG PO TABS: 650.0000 mg | ORAL_TABLET | ORAL | Status: DC | PRN

## 2014-12-25 MED ORDER — OXYCODONE-ACETAMINOPHEN 5-325 MG PO TABS: 2.0000 | ORAL_TABLET | Freq: Four times a day (QID) | ORAL | Status: DC | PRN

## 2014-12-25 MED ORDER — DOCUSATE SODIUM 100 MG PO CAPS
100.0000 mg | ORAL_CAPSULE | Freq: Two times a day (BID) | ORAL | Status: DC
  Administered 2014-12-25: 100 mg via ORAL
  Filled 2014-12-25: qty 1

## 2014-12-25 MED ORDER — HYDROMORPHONE HCL 1 MG/ML IJ SOLN
0.5000 mg | INTRAMUSCULAR | Status: DC | PRN
  Administered 2014-12-25 (×2): 1 mg via INTRAVENOUS
  Filled 2014-12-25 (×2): qty 1

## 2014-12-25 MED ORDER — HYOSCYAMINE SULFATE 0.125 MG SL SUBL
0.1250 mg | SUBLINGUAL_TABLET | SUBLINGUAL | Status: DC | PRN
  Filled 2014-12-25: qty 1

## 2014-12-25 NOTE — Discharge Summary (Signed)
Physician Discharge Summary  Patient ID: Raymond Chapman MRN: [id number] DOB/AGE: 01-Dec-1985 29 y.o.  Admit date: 12/24/2014 Discharge date: 12/25/2014  Admission Diagnoses: Left Ureteral Stone, rule out Pyelonephritis, Acute Renal Failure  Discharge Diagnoses:  Active Problems:   Left ureteral stone   Discharged Condition: good  Hospital Course:   1 - Left Ureteral Stone - left 6mm prox ureteral stone with mild hydro by ER CT 12/24/14 on eval left flank pain, fevers, malaise, nasuea / emesis. Stone 6mm, SSD 20cm, 350HU. NO additional stones. Underwent urgent left ureteral stent 12/24/14 for renal decompression.   2 - Acute Renal Failure - Cr 1.7 by ER labs up from baseline <1. Admits to poor PO intake with emesis. Did receive ketorolac in ER. F/u BMP 11/21 with Cr 1.3 and excellent UOP.   3 - Bacteruria, Leukocytosis - WBC 20k, subjective fevers, bacteruria on UA worriseom for obstructed pyelonephristis. Recieved Rocephin in ER. UCX, BCX obtained in ER and no growth at time of discharge. Also afebrile x 24 hours and resolved leukocytosis.  By the evening of POD 1, the day of discharge, he is ambulatory, afebrile, tollerating PO diet, pain controlled, and felt to be adequate for discharge.    Consults: None  Significant Diagnostic Studies: labs: as per above  Treatments: IV hydration, antibiotics: ceftriaxone and surgery:  left ureteral stent 12/24/14  Discharge Exam: Blood pressure 149/92, pulse 67, temperature 98.4 F (36.9 C), temperature source Oral, resp. rate 18, height  (1.803 m), weight 156.808 kg (345 lb 11.2 oz), SpO2 98 %. General appearance: alert, cooperative and appears stated age Head: Normocephalic, without obvious abnormality, atraumatic Eyes: negative Nose: Nares normal. Septum midline. Mucosa normal. No drainage or sinus tenderness. Throat: lips, mucosa, and tongue normal; teeth and gums normal Neck: supple, symmetrical, trachea midline Back:  symmetric, no curvature. ROM normal. No CVA tenderness. Resp: non-labored on room air Cardio: Nl rate GI: soft, non-tender; bowel sounds normal; no masses,  no organomegaly and morbid trundal obesity Male genitalia: normal Extremities: extremities normal, atraumatic, no cyanosis or edema Pulses: 2+ and symmetric Skin: Skin color, texture, turgor normal. No rashes or lesions Lymph nodes: Cervical, supraclavicular, and axillary nodes normal. Neurologic: Grossly normal  Foley removed by MD prior to discharge.   Disposition: 01-Home or Self Care     Medication List    TAKE these medications        amoxicillin-clavulanate 875-125 MG tablet  Commonly known as:  AUGMENTIN  Take 1 tablet by mouth 2 (two) times daily. One po bid x 7 days     ibuprofen 800 MG tablet  Commonly known as:  ADVIL,MOTRIN  Take 1 tablet (800 mg total) by mouth 3 (three) times daily.     ondansetron 4 MG disintegrating tablet  Commonly known as:  ZOFRAN ODT  Take 1 tablet (4 mg total) by mouth every 4 (four) hours as needed for nausea or vomiting.     oxyCODONE-acetaminophen 5-325 MG tablet  Commonly known as:  PERCOCET  Take 2 tablets by mouth every 6 (six) hours as needed for moderate pain. Post-operatively     senna-docusate 8.6-50 MG tablet  Commonly known as:  Senokot-S  Take 1 tablet by mouth 2 (two) times daily. While taking pain meds to prevent constipation           Follow-up Information    Follow up with Sebastian Ache, MD.   Specialty:  Urology   Why:  We will call to arrange next surgery for stone  removal.    Contact information:   [address] [place] [address] [phone]       Signed: Sebastian AcheMANNY, Karagan Lehr 12/25/2014, 6:51 PM

## 2014-12-25 NOTE — Anesthesia Postprocedure Evaluation (Signed)
Anesthesia Post Note  Patient: Raymond RobertsonDaniel Chapman  Procedure(s) Performed: Procedure(s) (LRB): CYSTOSCOPY WITH URETEROSCOPY AND STENT PLACEMENT (Left)  Patient location during evaluation: PACU Anesthesia Type: General Level of consciousness: awake and alert Pain management: pain level controlled Vital Signs Assessment: post-procedure vital signs reviewed and stable Respiratory status: spontaneous breathing, nonlabored ventilation, respiratory function stable and patient connected to nasal cannula oxygen Cardiovascular status: blood pressure returned to baseline and stable Postop Assessment: No signs of nausea or vomiting Anesthetic complications: no    Last Vitals:  Filed Vitals:   12/25/14 0016 12/25/14 0423  BP: 155/98 129/71  Pulse: 75 60  Temp: 36.8 C 37 C  Resp: 17 16    Last Pain:  Filed Vitals:   12/25/14 0428  PainSc: 8                  Earmon Sherrow A

## 2014-12-26 ENCOUNTER — Other Ambulatory Visit: Payer: Self-pay | Admitting: Urology

## 2014-12-29 LAB — CULTURE, BLOOD (ROUTINE X 2)
CULTURE: NO GROWTH
CULTURE: NO GROWTH

## 2015-01-16 ENCOUNTER — Encounter (HOSPITAL_BASED_OUTPATIENT_CLINIC_OR_DEPARTMENT_OTHER): Payer: Self-pay | Admitting: *Deleted

## 2015-01-18 NOTE — Progress Notes (Signed)
Unable to reach pt, his phone is not in service and lm on his mother's phone given by office.  Will need hx done dos.

## 2015-01-19 ENCOUNTER — Ambulatory Visit (HOSPITAL_BASED_OUTPATIENT_CLINIC_OR_DEPARTMENT_OTHER): Payer: 59 | Admitting: Anesthesiology

## 2015-01-19 ENCOUNTER — Ambulatory Visit (HOSPITAL_BASED_OUTPATIENT_CLINIC_OR_DEPARTMENT_OTHER)
Admission: RE | Admit: 2015-01-19 | Discharge: 2015-01-19 | Disposition: A | Discharge status: Home / Self Care | Payer: 59 | Source: Ambulatory Visit | Attending: Urology | Admitting: Urology

## 2015-01-19 ENCOUNTER — Encounter (HOSPITAL_BASED_OUTPATIENT_CLINIC_OR_DEPARTMENT_OTHER): Payer: Self-pay

## 2015-01-19 ENCOUNTER — Encounter (HOSPITAL_BASED_OUTPATIENT_CLINIC_OR_DEPARTMENT_OTHER)
Admission: RE | Disposition: A | Discharge status: Home / Self Care | Payer: Self-pay | Source: Ambulatory Visit | Attending: Urology

## 2015-01-19 DIAGNOSIS — N132 Hydronephrosis with renal and ureteral calculous obstruction: Secondary | ICD-10-CM | POA: Insufficient documentation

## 2015-01-19 DIAGNOSIS — Z87440 Personal history of urinary (tract) infections: Secondary | ICD-10-CM | POA: Insufficient documentation

## 2015-01-19 DIAGNOSIS — F17210 Nicotine dependence, cigarettes, uncomplicated: Secondary | ICD-10-CM | POA: Diagnosis not present

## 2015-01-19 HISTORY — PX: CYSTOSCOPY WITH RETROGRADE PYELOGRAM, URETEROSCOPY AND STENT PLACEMENT: SHX5789

## 2015-01-19 HISTORY — DX: Calculus of ureter: N20.1

## 2015-01-19 LAB — POCT I-STAT, CHEM 8
BUN: 13 mg/dL (ref 6–20)
CREATININE: 1.2 mg/dL (ref 0.61–1.24)
Calcium, Ion: 1.19 mmol/L (ref 1.12–1.23)
Chloride: 103 mmol/L (ref 101–111)
Glucose, Bld: 97 mg/dL (ref 65–99)
HEMATOCRIT: 48 % (ref 39.0–52.0)
HEMOGLOBIN: 16.3 g/dL (ref 13.0–17.0)
POTASSIUM: 3.9 mmol/L (ref 3.5–5.1)
SODIUM: 142 mmol/L (ref 135–145)
TCO2: 26 mmol/L (ref 0–100)

## 2015-01-19 SURGERY — CYSTOURETEROSCOPY, WITH RETROGRADE PYELOGRAM AND STENT INSERTION
Anesthesia: General | Laterality: Left

## 2015-01-19 MED ORDER — DEXAMETHASONE SODIUM PHOSPHATE 10 MG/ML IJ SOLN
INTRAMUSCULAR | Status: AC
  Filled 2015-01-19: qty 1

## 2015-01-19 MED ORDER — SULFAMETHOXAZOLE-TRIMETHOPRIM 800-160 MG PO TABS: 1.0000 | ORAL_TABLET | Freq: Two times a day (BID) | ORAL | Status: DC

## 2015-01-19 MED ORDER — METOCLOPRAMIDE HCL 5 MG/ML IJ SOLN
INTRAMUSCULAR | Status: AC
  Filled 2015-01-19: qty 2

## 2015-01-19 MED ORDER — LACTATED RINGERS IV SOLN
INTRAVENOUS | Status: DC
Start: 2015-01-19 — End: 2015-01-19
  Administered 2015-01-19: 08:00:00 via INTRAVENOUS
  Filled 2015-01-19: qty 1000

## 2015-01-19 MED ORDER — PROPOFOL 500 MG/50ML IV EMUL
INTRAVENOUS | Status: AC
  Filled 2015-01-19: qty 50

## 2015-01-19 MED ORDER — PROPOFOL 10 MG/ML IV BOLUS
INTRAVENOUS | Status: DC | PRN
  Administered 2015-01-19: 400 mg via INTRAVENOUS

## 2015-01-19 MED ORDER — MIDAZOLAM HCL 5 MG/5ML IJ SOLN
INTRAMUSCULAR | Status: DC | PRN
  Administered 2015-01-19: 2 mg via INTRAVENOUS

## 2015-01-19 MED ORDER — METOCLOPRAMIDE HCL 5 MG/ML IJ SOLN
INTRAMUSCULAR | Status: DC | PRN
  Administered 2015-01-19: 10 mg via INTRAVENOUS

## 2015-01-19 MED ORDER — FENTANYL CITRATE (PF) 100 MCG/2ML IJ SOLN
25.0000 ug | INTRAMUSCULAR | Status: DC | PRN
  Filled 2015-01-19: qty 1

## 2015-01-19 MED ORDER — LIDOCAINE HCL (CARDIAC) 20 MG/ML IV SOLN
INTRAVENOUS | Status: AC
  Filled 2015-01-19: qty 5

## 2015-01-19 MED ORDER — OXYCODONE-ACETAMINOPHEN 5-325 MG PO TABS: 1.0000 | ORAL_TABLET | Freq: Four times a day (QID) | ORAL | Status: DC | PRN

## 2015-01-19 MED ORDER — GENTAMICIN IN SALINE 1.6-0.9 MG/ML-% IV SOLN
80.0000 mg | INTRAVENOUS | Status: DC
  Filled 2015-01-19: qty 50

## 2015-01-19 MED ORDER — FENTANYL CITRATE (PF) 100 MCG/2ML IJ SOLN
INTRAMUSCULAR | Status: AC
  Filled 2015-01-19: qty 2

## 2015-01-19 MED ORDER — MIDAZOLAM HCL 2 MG/2ML IJ SOLN
INTRAMUSCULAR | Status: AC
  Filled 2015-01-19: qty 2

## 2015-01-19 MED ORDER — GENTAMICIN SULFATE 40 MG/ML IJ SOLN
5.0000 mg/kg | INTRAVENOUS | Status: AC
  Administered 2015-01-19: 530 mg via INTRAVENOUS
  Filled 2015-01-19 (×2): qty 13.25

## 2015-01-19 MED ORDER — LIDOCAINE HCL (CARDIAC) 20 MG/ML IV SOLN
INTRAVENOUS | Status: DC | PRN
  Administered 2015-01-19: 100 mg via INTRAVENOUS

## 2015-01-19 MED ORDER — KETOROLAC TROMETHAMINE 30 MG/ML IJ SOLN
INTRAMUSCULAR | Status: DC | PRN
  Administered 2015-01-19: 30 mg via INTRAVENOUS

## 2015-01-19 MED ORDER — FENTANYL CITRATE (PF) 100 MCG/2ML IJ SOLN
INTRAMUSCULAR | Status: DC | PRN
  Administered 2015-01-19 (×8): 25 ug via INTRAVENOUS

## 2015-01-19 MED ORDER — KETOROLAC TROMETHAMINE 30 MG/ML IJ SOLN
INTRAMUSCULAR | Status: AC
  Filled 2015-01-19: qty 1

## 2015-01-19 MED ORDER — SODIUM CHLORIDE 0.9 % IR SOLN
Status: DC | PRN
  Administered 2015-01-19: 1000 mL via INTRAVESICAL
  Administered 2015-01-19: 3000 mL via INTRAVESICAL
  Administered 2015-01-19: 500 mL via INTRAVESICAL

## 2015-01-19 MED ORDER — PROMETHAZINE HCL 25 MG/ML IJ SOLN
6.2500 mg | INTRAMUSCULAR | Status: DC | PRN
  Filled 2015-01-19: qty 1

## 2015-01-19 MED ORDER — DEXAMETHASONE SODIUM PHOSPHATE 10 MG/ML IJ SOLN
INTRAMUSCULAR | Status: DC | PRN
  Administered 2015-01-19: 10 mg via INTRAVENOUS

## 2015-01-19 MED ORDER — ONDANSETRON HCL 4 MG/2ML IJ SOLN
INTRAMUSCULAR | Status: DC | PRN
  Administered 2015-01-19: 4 mg via INTRAVENOUS

## 2015-01-19 MED ORDER — IOHEXOL 350 MG/ML SOLN
INTRAVENOUS | Status: DC | PRN
  Administered 2015-01-19: 19 mL via URETHRAL

## 2015-01-19 MED ORDER — ONDANSETRON HCL 4 MG/2ML IJ SOLN
INTRAMUSCULAR | Status: AC
  Filled 2015-01-19: qty 2

## 2015-01-19 SURGICAL SUPPLY — 38 items
BAG DRAIN URO-CYSTO SKYTR STRL (DRAIN) ×2 IMPLANT
BASKET DAKOTA 1.9FR 11X120 (BASKET) IMPLANT
BASKET LASER NITINOL 1.9FR (BASKET) ×2 IMPLANT
BASKET STNLS GEMINI 4WIRE 3FR (BASKET) IMPLANT
BASKET ZERO TIP NITINOL 2.4FR (BASKET) IMPLANT
CANISTER SUCT LVC 12 LTR MEDI- (MISCELLANEOUS) IMPLANT
CATH INTERMIT  6FR 70CM (CATHETERS) ×2 IMPLANT
CATH URET 5FR 28IN CONE TIP (BALLOONS)
CATH URET 5FR 28IN OPEN ENDED (CATHETERS) IMPLANT
CATH URET 5FR 70CM CONE TIP (BALLOONS) IMPLANT
CLOTH BEACON ORANGE TIMEOUT ST (SAFETY) ×2 IMPLANT
ELECT REM PT RETURN 9FT ADLT (ELECTROSURGICAL)
ELECTRODE REM PT RTRN 9FT ADLT (ELECTROSURGICAL) IMPLANT
FIBER LASER FLEXIVA 365 (UROLOGICAL SUPPLIES) IMPLANT
FIBER LASER TRAC TIP (UROLOGICAL SUPPLIES) IMPLANT
GLOVE BIO SURGEON STRL SZ7.5 (GLOVE) ×2 IMPLANT
GOWN STRL REUS W/ TWL LRG LVL3 (GOWN DISPOSABLE) ×2 IMPLANT
GOWN STRL REUS W/ TWL XL LVL3 (GOWN DISPOSABLE) ×1 IMPLANT
GOWN STRL REUS W/TWL LRG LVL3 (GOWN DISPOSABLE) ×2
GOWN STRL REUS W/TWL XL LVL3 (GOWN DISPOSABLE) ×1
GUIDEWIRE 0.038 PTFE COATED (WIRE) IMPLANT
GUIDEWIRE ANG ZIPWIRE 038X150 (WIRE) ×2 IMPLANT
GUIDEWIRE STR DUAL SENSOR (WIRE) ×2 IMPLANT
IV NS IRRIG 3000ML ARTHROMATIC (IV SOLUTION) ×2 IMPLANT
KIT BALLIN UROMAX 15FX10 (LABEL) IMPLANT
KIT BALLN UROMAX 15FX4 (MISCELLANEOUS) IMPLANT
KIT BALLN UROMAX 26 75X4 (MISCELLANEOUS)
KIT ROOM TURNOVER WOR (KITS) ×2 IMPLANT
MANIFOLD NEPTUNE II (INSTRUMENTS) ×2 IMPLANT
PACK CYSTO (CUSTOM PROCEDURE TRAY) ×2 IMPLANT
SET HIGH PRES BAL DIL (LABEL)
SHEATH ACCESS URETERAL 38CM (SHEATH) ×2 IMPLANT
STENT URET 6FRX26 CONTOUR (STENTS) ×2 IMPLANT
SYR 30ML LL (SYRINGE) ×2 IMPLANT
SYRINGE 10CC LL (SYRINGE) IMPLANT
SYRINGE IRR TOOMEY STRL 70CC (SYRINGE) IMPLANT
TUBE CONNECTING 12X1/4 (SUCTIONS) IMPLANT
TUBE FEEDING 8FR 16IN STR KANG (MISCELLANEOUS) ×2 IMPLANT

## 2015-01-19 NOTE — Brief Op Note (Signed)
01/19/2015  10:04 AM  PATIENT:  Luberta Robertsonaniel Nickey  29 y.o. male  PRE-OPERATIVE DIAGNOSIS:  LEFT URETERAL STONE  POST-OPERATIVE DIAGNOSIS:  LEFT URETERAL STONE  PROCEDURE:  Procedure(s): CYSTOSCOPY WITH LEFT RETROGRADE PYELOGRAM, URETEROSCOPY, BASKETING OF STONES AND STENT EXCHANGE (Left)  SURGEON:  Surgeon(s) and Role:    * Sebastian Acheheodore Sebastien Jackson, MD - Primary  PHYSICIAN ASSISTANT:   ASSISTANTS: none   ANESTHESIA:   general  EBL:  Total I/O In: 700 [I.V.:700] Out: -   BLOOD ADMINISTERED:none  DRAINS: none   LOCAL MEDICATIONS USED:  NONE  SPECIMEN:  Source of Specimen:  tiny stone fragments  DISPOSITION OF SPECIMEN:  irrigated for discard  COUNTS:  YES  TOURNIQUET:  * No tourniquets in log *  DICTATION: .Other Dictation: Dictation Number [id number]  PLAN OF CARE: Discharge to home after PACU  PATIENT DISPOSITION:  PACU - hemodynamically stable.   Delay start of Pharmacological VTE agent (>24hrs) due to surgical blood loss or risk of bleeding: yes

## 2015-01-19 NOTE — Transfer of Care (Signed)
Immediate Anesthesia Transfer of Care Note  Patient: Raymond RobertsonDaniel Stotz  Procedure(s) Performed: Procedure(s) (LRB): CYSTOSCOPY WITH LEFT RETROGRADE PYELOGRAM, URETEROSCOPY, BASKETING OF STONES AND STENT EXCHANGE (Left)  Patient Location: PACU  Anesthesia Type: General  Level of Consciousness: awake, sedated, patient cooperative and responds to stimulation  Airway & Oxygen Therapy: Patient Spontanous Breathing and Patient connected to face mask oxygen  Post-op Assessment: Report given to PACU RN, Post -op Vital signs reviewed and stable and Patient moving all extremities  Post vital signs: Reviewed and stable  Complications: No apparent anesthesia complications

## 2015-01-19 NOTE — OR Nursing (Signed)
Dr. Berneice HeinrichManny removed [id number] left ureteral stent.  Unable to locate prior documentation of stent placement.

## 2015-01-19 NOTE — Anesthesia Procedure Notes (Addendum)
Procedure Name: LMA Insertion Date/Time: 01/19/2015 9:37 AM Performed by: Jessica PriestBEESON, Zamya Culhane C Pre-anesthesia Checklist: Patient identified, Emergency Drugs available, Suction available and Patient being monitored Patient Re-evaluated:Patient Re-evaluated prior to inductionOxygen Delivery Method: Circle System Utilized Preoxygenation: Pre-oxygenation with 100% oxygen Intubation Type: IV induction Ventilation: Mask ventilation without difficulty LMA: LMA inserted LMA Size: 5.0 Number of attempts: 1 Airway Equipment and Method: bite block Placement Confirmation: positive ETCO2 Tube secured with: Tape Dental Injury: Teeth and Oropharynx as per pre-operative assessment  Comments: Pt pre O2 well before start of induction 100 % O2 . Positioned with grey wedge shoulder support and yellow gel head rest and blankets x 4 for proper airway positioning - Smooth IV induction and LMA placement - LB

## 2015-01-19 NOTE — H&P (Signed)
Raymond RobertsonDaniel Chapman is an 29 y.o. male.    Chief Complaint: Pre-op Left Ureteroscopic Stone Manipulation  HPI:   1 - Left Ureteral Stone - left 6mm prox ureteral stone with mild hydro by ER CT 12/24/14 on eval left flank pain, fevers, malaise, nasuea / emesis. Stone 6mm, SSD 20cm, 350HU. NO additional stones.   Left JJ stent placed 11/20 by Eskridge as some bacteruria, luekocytosis at time. Final UCX negative.   PMH sig for morbid obesity.  No CV disease.   Today "Raymond Chapman" is seen to proceed with left ureteroscopic stone manipualiton for definitive management of left ureteral stone. No interval fevers.          Past Medical History  Diagnosis Date  . Left ureteral stone     Past Surgical History  Procedure Laterality Date  . Cystoscopy with ureteroscopy and stent placement Left 12/24/2014    Procedure: CYSTOSCOPY WITH URETEROSCOPY AND STENT PLACEMENT;  Surgeon: Jerilee FieldMatthew Eskridge, MD;  [facility];  Service: Urology;  Laterality: Left;    No family history on file. Social History:  reports that he has been smoking Cigarettes.  He has been smoking about 0.50 packs per day. He does not have any smokeless tobacco history on file. He reports that he does not drink alcohol or use illicit drugs.  Allergies:  Allergies  Allergen Reactions  . Demerol [Meperidine] Other (See Comments)    hyperactivity    No prescriptions prior to admission    No results found for this or any previous visit (from the past 48 hour(s)). No results found.  Review of Systems  Constitutional: Negative.  Negative for fever and chills.  HENT: Negative.   Eyes: Negative.   Respiratory: Negative.   Cardiovascular: Negative.   Gastrointestinal: Negative.   Genitourinary: Positive for urgency.       Admits to mild stent colic  Musculoskeletal: Negative.   Skin: Negative.   Neurological: Negative.   Endo/Heme/Allergies: Negative.   Psychiatric/Behavioral: Negative.     There were no vitals taken for  this visit. Physical Exam  Constitutional: He appears well-developed.  HENT:  Head: Normocephalic.  Eyes: Pupils are equal, round, and reactive to light.  Neck: Normal range of motion.  Cardiovascular: Normal rate.   Respiratory: Effort normal.  GI: Soft.  Genitourinary: Penis normal.  No CVAT  Musculoskeletal: Normal range of motion.  Neurological: He is alert.  Skin: Skin is warm.  Psychiatric: He has a normal mood and affect. His behavior is normal. Judgment and thought content normal.     Assessment/Plan  1 - Left Ureteral Stone - proceed as planned today. Risks, benefits, alternatives, need for peri-op stents discused.   Raymond Chapman 01/19/2015, 6:00 AM

## 2015-01-19 NOTE — Anesthesia Postprocedure Evaluation (Signed)
Anesthesia Post Note  Patient: Raymond RobertsonDaniel Chapman  Procedure(s) Performed: Procedure(s) (LRB): CYSTOSCOPY WITH LEFT RETROGRADE PYELOGRAM, URETEROSCOPY, BASKETING OF STONES AND STENT EXCHANGE (Left)  Patient location during evaluation: PACU Anesthesia Type: General Level of consciousness: awake and alert Pain management: pain level controlled Vital Signs Assessment: post-procedure vital signs reviewed and stable Respiratory status: spontaneous breathing, nonlabored ventilation, respiratory function stable and patient connected to nasal cannula oxygen Cardiovascular status: blood pressure returned to baseline and stable Postop Assessment: no signs of nausea or vomiting Anesthetic complications: no    Last Vitals:  Filed Vitals:   01/19/15 1035 01/19/15 1107  BP: 120/67 122/89  Pulse: 66 64  Temp:  36.5 C  Resp: 13 14    Last Pain:  Filed Vitals:   01/19/15 1110  PainSc: 3                  Gisele Pack J

## 2015-01-19 NOTE — Anesthesia Preprocedure Evaluation (Addendum)
Anesthesia Evaluation  Patient identified by MRN, date of birth, ID band Patient awake    Reviewed: Allergy & Precautions, NPO status , Patient's Chart, lab work & pertinent test results  Airway Mallampati: II  TM Distance: >3 FB Neck ROM: Full    Dental no notable dental hx.    Pulmonary Current Smoker,    Pulmonary exam normal breath sounds clear to auscultation       Cardiovascular negative cardio ROS Normal cardiovascular exam Rhythm:Regular Rate:Normal     Neuro/Psych negative neurological ROS  negative psych ROS   GI/Hepatic negative GI ROS, Neg liver ROS,   Endo/Other  Morbid obesity  Renal/GU negative Renal ROS  negative genitourinary   Musculoskeletal negative musculoskeletal ROS (+)   Abdominal   Peds negative pediatric ROS (+)  Hematology negative hematology ROS (+)   Anesthesia Other Findings   Reproductive/Obstetrics negative OB ROS                            Anesthesia Physical Anesthesia Plan  ASA: III  Anesthesia Plan: General   Post-op Pain Management:    Induction: Intravenous  Airway Management Planned: LMA  Additional Equipment:   Intra-op Plan:   Post-operative Plan: Extubation in OR  Informed Consent: I have reviewed the patients History and Physical, chart, labs and discussed the procedure including the risks, benefits and alternatives for the proposed anesthesia with the patient or authorized representative who has indicated his/her understanding and acceptance.   Dental advisory given  Plan Discussed with: CRNA  Anesthesia Plan Comments:         Anesthesia Quick Evaluation

## 2015-01-19 NOTE — Discharge Instructions (Signed)
1 - You may have urinary urgency (bladder spasms) and bloody urine on / off with stent in place. This is normal. ° °2 - Call MD or go to ER for fever >102, severe pain / nausea / vomiting not relieved by medications, or acute change in medical status ° °3 - Remove tethered stent on Monday morning at home by pulling on string, then blue-white plastic tubing, and discarding. Dr. Manny is in the office Monday if any issues arise.  ° ° ° °[facility] °[phone] °Post Ureteroscopy With or Without Stent Instructions ° °Definitions: ° °Ureter: The duct that transports urine from the kidney to the bladder. °Stent:   A plastic hollow tube that is placed into the ureter, from the kidney to the                 bladder to prevent the ureter from swelling shut. ° °GENERAL INSTRUCTIONS: ° °Despite the fact that no skin incisions were used, the area around the ureter and bladder is raw and irritated. The stent is a foreign body which will further irritate the bladder wall. This irritation is manifested by increased frequency of urination, both day and night, and by an increase in the urge to urinate. In some, the urge to urinate is present almost always. Sometimes the urge is strong enough that you may not be able to stop yourself from urinating. The only real cure is to remove the stent and then give time for the bladder wall to heal which can't be done until the danger of the ureter swelling shut has passed, which varies. ° °You may see some blood in your urine while the stent is in place and a few days afterwards. Do not be alarmed, even if the urine was clear for a while. Get off your feet and drink lots of fluids until clearing occurs. If you start to pass clots or don't improve, call us. ° °DIET: °You may return to your normal diet immediately. Because of the raw surface of your bladder, alcohol, spicy foods, acid type foods and drinks with caffeine may cause irritation or frequency and should be used in  moderation. To keep your urine flowing freely and to avoid constipation, drink plenty of fluids during the day ( 8-10 glasses ). °Tip: Avoid cranberry juice because it is very acidic. ° °ACTIVITY: °Your physical activity doesn't need to be restricted. However, if you are very active, you may see some blood in your urine. We suggest that you reduce your activity under these circumstances until the bleeding has stopped. ° °BOWELS: °It is important to keep your bowels regular during the postoperative period. Straining with bowel movements can cause bleeding. A bowel movement every other day is reasonable. Use a mild laxative if needed, such as Milk of Magnesia 2-3 tablespoons, or 2 Dulcolax tablets. Call if you continue to have problems. If you have been taking narcotics for pain, before, during or after your surgery, you may be constipated. Take a laxative if necessary. ° ° °MEDICATION: °You should resume your pre-surgery medications unless told not to. In addition you will often be given an antibiotic to prevent infection. These should be taken as prescribed until the bottles are finished unless you are having an unusual reaction to one of the drugs. ° °PROBLEMS YOU SHOULD REPORT TO US: °· Fevers over 100.5 Fahrenheit. °· Heavy bleeding, or clots ( See above notes about blood in urine ). °· Inability to urinate. °· Drug reactions ( hives, rash,   nausea, vomiting, diarrhea ). °· Severe burning or pain with urination that is not improving. ° °FOLLOW-UP: °You will need a follow-up appointment to monitor your progress. Call for this appointment at the number listed above. Usually the first appointment will be about three to fourteen days after your surgery. ° ° ° ° °Post Anesthesia Home Care Instructions ° °Activity: °Get plenty of rest for the remainder of the day. A responsible adult should stay with you for 24 hours following the procedure.  °For the next 24 hours, DO NOT: °-Drive a car °-Operate machinery °-Drink  alcoholic beverages °-Take any medication unless instructed by your physician °-Make any legal decisions or sign important papers. ° °Meals: °Start with liquid foods such as gelatin or soup. Progress to regular foods as tolerated. Avoid greasy, spicy, heavy foods. If nausea and/or vomiting occur, drink only clear liquids until the nausea and/or vomiting subsides. Call your physician if vomiting continues. ° °Special Instructions/Symptoms: °Your throat may feel dry or sore from the anesthesia or the breathing tube placed in your throat during surgery. If this causes discomfort, gargle with warm salt water. The discomfort should disappear within 24 hours. ° °If you had a scopolamine patch placed behind your ear for the management of post- operative nausea and/or vomiting: ° °1. The medication in the patch is effective for 72 hours, after which it should be removed.  Wrap patch in a tissue and discard in the trash. Wash hands thoroughly with soap and water. °2. You may remove the patch earlier than 72 hours if you experience unpleasant side effects which may include dry mouth, dizziness or visual disturbances. °3. Avoid touching the patch. Wash your hands with soap and water after contact with the patch. °  ° ° ° ° °

## 2015-01-22 ENCOUNTER — Encounter (HOSPITAL_BASED_OUTPATIENT_CLINIC_OR_DEPARTMENT_OTHER): Payer: Self-pay | Admitting: Urology

## 2015-01-22 NOTE — Op Note (Signed)
NAME:  Raymond Chapman, Raymond Chapman NO.:  [id number]  MEDICAL RECORD NO.:  [id number]  LOCATION:                               FACILITY:  [facility]  PHYSICIAN:  Raymond Ache, MD     DATE OF BIRTH:  22-Jan-1986  DATE OF PROCEDURE:  01/19/2015                              OPERATIVE REPORT  DIAGNOSES:  Prior left ureteral stone with history of urinary tract infection.  PROCEDURES: 1. Cystoscopy with left retrograde pyelogram and interpretation. 2. Exchange of left ureteral stent, 5 x 26, Polaris with tether. 3. Left ureteroscopy with basketing of stones.  ESTIMATED BLOOD LOSS:  Nil.  COMPLICATIONS:  None.  SPECIMEN:  Tiny left renal stone fragments, irrigated for discard.  FINDINGS: 1. Lower pole conglomerate of very soft likely matrix stones, all     stone fragments larger than 1/3-mm removed, irrigated. 2. Unremarkable left retrograde pyelogram. 3. Successful replacement of left ureteral stent, proximal in the     renal pelvis and distal in the urinary bladder.  INDICATIONS:  Raymond Chapman is a pleasant 29 year old gentleman with history of obesity, who was found on workup of colicky flank pain and subjective malaise to have a left proximal ureteral stone, leukocytosis worrisome for impending urosepsis last month.  He underwent temporizing with left ureteral stent with a plan for definitive ureteroscopy after cleared as infectious parameters, which he has since done.  Most recent urinary culture was negative.  He now presents for definitive ureteroscopy on the left.  Informed consent was obtained and placed in the medical record.  PROCEDURE IN DETAIL:  The patient being Raymond Chapman, was verified. Procedure being left ureteroscopic stone manipulation was confirmed. Procedure was carried out.  Time-out was confirmed.  Intravenous antibiotics were administered.  General LMA anesthesia was introduced. The patient was placed into a low lithotomy position and sterile field was  created by prepping and draping the patient's penis, perineum and proximal thighs using iodine x3.  Next, cystourethroscopy was performed using a 23-French rigid cystoscope with 30-degree offset lens. Inspection of the anterior and posterior urethra was unremarkable. Inspection of the urinary bladder revealed no diverticula, calcifications, papillary lesions.  The distal end of the left ureteral stent was seen in situ with mild encrustation, this was grasped and brought out in its entirety, set aside for discard.  Next, the left ureteral orifice was cannulated with a 6-French end-hole catheter and left retrograde pyelogram was obtained.  Left retrograde pyelogram demonstrated a single left ureter with single- system left kidney.  No filling defects or narrowing noted.  A 0.038 zip wire was advanced at the level of the upper pole and set aside as a safety wire.  An 8-French feeding tube was placed in the urinary bladder for pressure release.  Next, semi-rigid ureteroscopy was performed on the distal half of the left ureter alongside a separate Sensor working wire.  No mucosal abnormalities were found.  Next, a 12/14, 36-cm ureteral access sheath was carefully placed over the working wire to the level of the midureter using continuous fluoroscopic guidance and flexible digital ureteroscopy was performed of the proximal ureter using a single channel flexible digital ureteroscope.  No significant abnormalities  were seen of the proximal ureter.  Systematic inspection of the kidney was performed including all calices x3.  A conglomerate of old-appearing clot and stone debris was noted in the lower pole calyx. Using combination of sequential basketing and irrigation via the ureteroscope, this conglomerate of clot and very soft stone was completely removed.  All stone fragments were exquisitely small individually, each less than the third of a millimeter.  Multiple attempts were taken throughout  the basket, the representative fragment for compositional analysis; however, the stone fragments were too small to allow this.  As such, they were irrigated, such that all stone fragments larger than 1/3-mm were completely removed as was the old- appearing clot.  The kidney was once again inspected including all calices x2 and no mucosal abnormalities or calcifications were encountered.  The access sheath was removed under continuous uteroscopic vision and no significant mucosal abnormalities were found.  Given the use of the access sheath, it was felt that interval stenting would be warranted.  As such, a new 5 x 26, Polaris type stent was placed over the remaining safety wire using fluoroscopic guidance.  Good proximal and distal deployment were noted.  Tether was left in place and fashioned on the dorsum of the penis and procedure was terminated.  The patient tolerated the procedure well.  There were no immediate complications.  The patient was taken to the postanesthesia care unit in stable condition.          ______________________________ Raymond Acheheodore Laporshia Hogen, MD     TM/MEDQ  D:  01/19/2015  T:  01/20/2015  Job:  [id number]

## 2015-05-18 ENCOUNTER — Emergency Department (HOSPITAL_COMMUNITY): Payer: 59

## 2015-05-18 ENCOUNTER — Encounter (HOSPITAL_COMMUNITY): Payer: Self-pay

## 2015-05-18 ENCOUNTER — Emergency Department (HOSPITAL_COMMUNITY)
Admission: EM | Admit: 2015-05-18 | Discharge: 2015-05-19 | Disposition: A | Discharge status: Home / Self Care | Payer: 59 | Attending: Emergency Medicine | Admitting: Emergency Medicine

## 2015-05-18 DIAGNOSIS — R34 Anuria and oliguria: Secondary | ICD-10-CM | POA: Diagnosis not present

## 2015-05-18 DIAGNOSIS — R6883 Chills (without fever): Secondary | ICD-10-CM | POA: Insufficient documentation

## 2015-05-18 DIAGNOSIS — F17210 Nicotine dependence, cigarettes, uncomplicated: Secondary | ICD-10-CM | POA: Diagnosis not present

## 2015-05-18 DIAGNOSIS — R112 Nausea with vomiting, unspecified: Secondary | ICD-10-CM | POA: Diagnosis not present

## 2015-05-18 DIAGNOSIS — N200 Calculus of kidney: Secondary | ICD-10-CM

## 2015-05-18 DIAGNOSIS — Z87448 Personal history of other diseases of urinary system: Secondary | ICD-10-CM | POA: Diagnosis not present

## 2015-05-18 DIAGNOSIS — R1084 Generalized abdominal pain: Secondary | ICD-10-CM | POA: Diagnosis not present

## 2015-05-18 DIAGNOSIS — Z79899 Other long term (current) drug therapy: Secondary | ICD-10-CM | POA: Insufficient documentation

## 2015-05-18 DIAGNOSIS — R109 Unspecified abdominal pain: Secondary | ICD-10-CM | POA: Diagnosis present

## 2015-05-18 LAB — CBC WITH DIFFERENTIAL/PLATELET
BASOS ABS: 0.1 10*3/uL (ref 0.0–0.1)
BASOS PCT: 0 %
EOS ABS: 0.4 10*3/uL (ref 0.0–0.7)
Eosinophils Relative: 2 %
HEMATOCRIT: 44.1 % (ref 39.0–52.0)
HEMOGLOBIN: 15.7 g/dL (ref 13.0–17.0)
Lymphocytes Relative: 23 %
Lymphs Abs: 4.3 10*3/uL — ABNORMAL HIGH (ref 0.7–4.0)
MCH: 30.4 pg (ref 26.0–34.0)
MCHC: 35.6 g/dL (ref 30.0–36.0)
MCV: 85.3 fL (ref 78.0–100.0)
MONOS PCT: 7 %
Monocytes Absolute: 1.3 10*3/uL — ABNORMAL HIGH (ref 0.1–1.0)
NEUTROS ABS: 12.6 10*3/uL — AB (ref 1.7–7.7)
NEUTROS PCT: 68 %
Platelets: 289 10*3/uL (ref 150–400)
RBC: 5.17 MIL/uL (ref 4.22–5.81)
RDW: 13.2 % (ref 11.5–15.5)
WBC: 18.7 10*3/uL — AB (ref 4.0–10.5)

## 2015-05-18 LAB — URINE MICROSCOPIC-ADD ON: RBC / HPF: NONE SEEN RBC/hpf (ref 0–5)

## 2015-05-18 LAB — URINALYSIS, ROUTINE W REFLEX MICROSCOPIC
Bilirubin Urine: NEGATIVE
GLUCOSE, UA: NEGATIVE mg/dL
KETONES UR: NEGATIVE mg/dL
Nitrite: NEGATIVE
PH: 5 (ref 5.0–8.0)
Protein, ur: NEGATIVE mg/dL
SPECIFIC GRAVITY, URINE: 1.025 (ref 1.005–1.030)

## 2015-05-18 LAB — I-STAT CHEM 8, ED
BUN: 15 mg/dL (ref 6–20)
CHLORIDE: 101 mmol/L (ref 101–111)
CREATININE: 1.5 mg/dL — AB (ref 0.61–1.24)
Calcium, Ion: 1.13 mmol/L (ref 1.12–1.23)
Glucose, Bld: 94 mg/dL (ref 65–99)
HEMATOCRIT: 48 % (ref 39.0–52.0)
Hemoglobin: 16.3 g/dL (ref 13.0–17.0)
POTASSIUM: 3.7 mmol/L (ref 3.5–5.1)
SODIUM: 139 mmol/L (ref 135–145)
TCO2: 25 mmol/L (ref 0–100)

## 2015-05-18 MED ORDER — HYDROMORPHONE HCL 1 MG/ML IJ SOLN
1.0000 mg | Freq: Once | INTRAMUSCULAR | Status: AC
  Administered 2015-05-18: 1 mg via INTRAVENOUS
  Filled 2015-05-18: qty 1

## 2015-05-18 MED ORDER — OXYCODONE-ACETAMINOPHEN 5-325 MG PO TABS
ORAL_TABLET | ORAL | Status: AC
  Filled 2015-05-18: qty 1

## 2015-05-18 MED ORDER — OXYCODONE-ACETAMINOPHEN 5-325 MG PO TABS
1.0000 | ORAL_TABLET | ORAL | Status: DC | PRN
Start: 2015-05-18 — End: 2015-05-19
  Administered 2015-05-18: 1 via ORAL

## 2015-05-18 MED ORDER — ONDANSETRON HCL 4 MG/2ML IJ SOLN
4.0000 mg | Freq: Once | INTRAMUSCULAR | Status: AC
  Administered 2015-05-18: 4 mg via INTRAVENOUS
  Filled 2015-05-18: qty 2

## 2015-05-18 NOTE — ED Provider Notes (Signed)
CSN: [id number]     Arrival date & time 05/18/15  2130 History  By signing my name below, I, Soijett Blue, attest that this documentation has been prepared under the direction and in the presence of Trixie Dredge, PA-C Electronically Signed: Soijett Blue, ED Scribe. 05/18/2015. 9:42 PM.   Chief Complaint  Patient presents with  . Flank Pain      The history is provided by the patient. No language interpreter was used.    HPI Comments: Raymond Chapman is a 30 y.o. male with a medical hx of left ureteral stone who presents to the Emergency Department complaining of constant, sharp, progressively worsening left sided flank pain onset this morning. Pt reports that he drank 1 gallon of water today but despite this has had decreased urine output. Pt left sided flank pain is worsened with sitting and denies any alleviating factors. He states that he is having associated symptoms of nausea and vomiting this morning, and decreased urine output. He states that he has not tried any medications for the relief for his symptoms. He denies dysuria, frequency,urgency, bowel changes (including diarrhea, blood in stool, constipation), CP, SOB, and any other symptoms. Pt is allergic to demerol.    Per pt chart review: Pt was seen by urologist, Dr. Berneice Heinrich on 12/2014 for left sided kidney stone and acute renal failure. Pt had cystoscopy with ureteroscopy and stent placement on 12/24/2014. Pt also had cystoscopy with retrograde pyelogram, ureteroscopy and stent placement on 01/19/2015.     Past Medical History  Diagnosis Date  . Left ureteral stone    Past Surgical History  Procedure Laterality Date  . Cystoscopy with ureteroscopy and stent placement Left 12/24/2014    Procedure: CYSTOSCOPY WITH URETEROSCOPY AND STENT PLACEMENT;  Surgeon: Jerilee Field, MD;  Location: [facility] OR;  Service: Urology;  Laterality: Left;  . Cystoscopy with retrograde pyelogram, ureteroscopy and stent placement Left 01/19/2015     Procedure: CYSTOSCOPY WITH LEFT RETROGRADE PYELOGRAM, URETEROSCOPY, BASKETING OF STONES AND STENT EXCHANGE;  Surgeon: Sebastian Ache, MD;  Location: [facility];  Service: Urology;  Laterality: Left;   No family history on file. Social History  Substance Use Topics  . Smoking status: Current Every Day Smoker -- 0.50 packs/day    Types: Cigarettes  . Smokeless tobacco: Former Neurosurgeon  . Alcohol Use: No    Review of Systems  Constitutional: Positive for fever (subjective) and chills.  Respiratory: Negative for shortness of breath.   Cardiovascular: Negative for chest pain.  Gastrointestinal: Positive for nausea and vomiting. Negative for constipation and blood in stool.  Genitourinary: Positive for flank pain (left sided) and decreased urine volume. Negative for dysuria, frequency and hematuria.  All other systems reviewed and are negative.     Allergies  Demerol  Home Medications   Prior to Admission medications   Medication Sig Start Date End Date Taking? Authorizing Provider  ibuprofen (ADVIL,MOTRIN) 800 MG tablet Take 1 tablet (800 mg total) by mouth 3 (three) times daily. 12/24/14   Arby Barrette, MD  ondansetron (ZOFRAN ODT) 4 MG disintegrating tablet Take 1 tablet (4 mg total) by mouth every 4 (four) hours as needed for nausea or vomiting. 12/24/14   Arby Barrette, MD  oxyCODONE-acetaminophen (PERCOCET) 5-325 MG tablet Take 1-2 tablets by mouth every 6 (six) hours as needed for moderate pain. Post-operatively 01/19/15   Sebastian Ache, MD  senna-docusate (SENOKOT-S) 8.6-50 MG tablet Take 1 tablet by mouth 2 (two) times daily. While taking pain meds to prevent constipation  12/25/14   Sebastian Acheheodore Manny, MD  sulfamethoxazole-trimethoprim (BACTRIM DS,SEPTRA DS) 800-160 MG tablet Take 1 tablet by mouth 2 (two) times daily. X 4 days to prevent post-op infection with stent in place 01/19/15   Sebastian Acheheodore Manny, MD   BP 161/107 mmHg  Pulse 101  Temp(Src) 98.4 F (36.9 C)  (Oral)  Resp 20  Wt 341 lb 3 oz (154.762 kg)  SpO2 100% Physical Exam  Constitutional: He appears well-developed and well-nourished. No distress.  Pt appears uncomfortable.   HENT:  Head: Normocephalic and atraumatic.  Neck: Neck supple.  Cardiovascular: Normal rate and regular rhythm.   Pulmonary/Chest: Effort normal and breath sounds normal. No respiratory distress. He has no wheezes. He has no rales.  Abdominal: Soft. He exhibits no distension and no mass. There is tenderness. There is CVA tenderness. There is no rebound and no guarding.  Left CVA tenderness and diffuse left sided abdominal tenderness.   Neurological: He is alert. He exhibits normal muscle tone.  Skin: He is not diaphoretic.  Nursing note and vitals reviewed.   ED Course  Procedures (including critical care time) DIAGNOSTIC STUDIES: Oxygen Saturation is 100% on RA, nl by my interpretation.    COORDINATION OF CARE: 9:41 PM Discussed treatment plan with pt at bedside which includes UA, CT renal stone study and pt agreed to plan.    Labs Review Labs Reviewed  URINALYSIS, ROUTINE W REFLEX MICROSCOPIC (NOT AT [facility]) - Abnormal; Notable for the following:    Hgb urine dipstick SMALL (*)    Leukocytes, UA TRACE (*)    All other components within normal limits  URINE MICROSCOPIC-ADD ON - Abnormal; Notable for the following:    Squamous Epithelial / LPF 0-5 (*)    Bacteria, UA RARE (*)    All other components within normal limits  CBC WITH DIFFERENTIAL/PLATELET - Abnormal; Notable for the following:    WBC 18.7 (*)    Neutro Abs 12.6 (*)    Lymphs Abs 4.3 (*)    Monocytes Absolute 1.3 (*)    All other components within normal limits  I-STAT CHEM 8, ED - Abnormal; Notable for the following:    Creatinine, Ser 1.50 (*)    All other components within normal limits  URINE CULTURE    Imaging Review No results found. I have personally reviewed and evaluated these images and lab results as part of my medical  decision-making.   EKG Interpretation None      MDM   Final diagnoses:  Left flank pain    Afebrile nontoxic patient with hx kidney stones presents with left flank pain, N/V that began this morning.  UA does not appear infected.  Urine cx is pending. Lab significant for mild renal insufficiency and leukocytosis.  CT pending at change of shift.  Pt signed out to Santiago GladHeather Laisure, PA-C, who assumes care of pt pending CT.  Patient's urologist is Dr Berneice HeinrichManny.    I personally performed the services described in this documentation, which was scribed in my presence. The recorded information has been reviewed and is accurate.    Trixie Dredgemily Jamaiyah Pyle, PA-C 05/18/15 2318  Vanetta MuldersScott Zackowski, MD 05/24/15 0000

## 2015-05-18 NOTE — ED Notes (Signed)
Pain medication administered in triage - pt advised no drinking, driving or operating heavy machinery within 4-6 hours. Pt reports he has a ride home.

## 2015-05-18 NOTE — ED Notes (Signed)
Pt reports left sided flank pain with "a weak stream" when urinating today. Hx of kidney stones and this feels similar.

## 2015-05-19 MED ORDER — ONDANSETRON 4 MG PO TBDP: 4.0000 mg | ORAL_TABLET | Freq: Three times a day (TID) | ORAL | Status: DC | PRN

## 2015-05-19 MED ORDER — TAMSULOSIN HCL 0.4 MG PO CAPS: 0.4000 mg | ORAL_CAPSULE | Freq: Every day | ORAL | Status: DC

## 2015-05-19 MED ORDER — OXYCODONE-ACETAMINOPHEN 5-325 MG PO TABS: 1.0000 | ORAL_TABLET | Freq: Four times a day (QID) | ORAL | Status: DC | PRN

## 2015-05-19 NOTE — ED Notes (Signed)
Pt. Verbalizes understanding of d/c instructions, prescriptions, and follow-up care. Pt. Verbalizes no concerns at this time. Pt. Ambulatory out of the unit with a steady gait. Pt. Disp[lays no s/s of distress at this time.

## 2015-05-19 NOTE — ED Provider Notes (Signed)
11:15 PM Patient signed out to me by Trixie DredgeEmily West, PA-C at shift change.  Patient with a history of Nephrolithiasis presents today with flank pain.  Patient awaiting CT renal stone study.  CT results: IMPRESSION: Mild left-sided hydronephrosis, with obstructing 3 mm stone noted distally, 3 cm above the left vesicoureteral junction.   12:23 AM Discussed CT results with the patient.  He reports significant improvement in pain at this time.  He states that he has urinated twice since the CT was performed and suspects that he may have passed the stone.  Feel that the patient is stable for discharge.  Patient instructed to follow up with his Urologist.  Return precautions given.    Santiago GladHeather Jazzmen Restivo, PA-C 05/19/15 [id number]  Tomasita CrumbleAdeleke Oni, MD 05/19/15 [id number]

## 2015-05-20 LAB — URINE CULTURE

## 2015-12-28 ENCOUNTER — Emergency Department (HOSPITAL_COMMUNITY): Payer: 59

## 2015-12-28 ENCOUNTER — Encounter (HOSPITAL_COMMUNITY): Payer: Self-pay | Admitting: Nurse Practitioner

## 2015-12-28 ENCOUNTER — Emergency Department (HOSPITAL_COMMUNITY)
Admission: EM | Admit: 2015-12-28 | Discharge: 2015-12-28 | Disposition: A | Discharge status: Home / Self Care | Payer: 59 | Attending: Emergency Medicine | Admitting: Emergency Medicine

## 2015-12-28 DIAGNOSIS — N202 Calculus of kidney with calculus of ureter: Secondary | ICD-10-CM | POA: Insufficient documentation

## 2015-12-28 DIAGNOSIS — F17210 Nicotine dependence, cigarettes, uncomplicated: Secondary | ICD-10-CM | POA: Diagnosis not present

## 2015-12-28 DIAGNOSIS — N201 Calculus of ureter: Secondary | ICD-10-CM

## 2015-12-28 DIAGNOSIS — R109 Unspecified abdominal pain: Secondary | ICD-10-CM | POA: Diagnosis present

## 2015-12-28 LAB — CBC WITH DIFFERENTIAL/PLATELET
BASOS ABS: 0.1 10*3/uL (ref 0.0–0.1)
Basophils Relative: 1 %
EOS ABS: 0.5 10*3/uL (ref 0.0–0.7)
EOS PCT: 3 %
HCT: 45.9 % (ref 39.0–52.0)
Hemoglobin: 16.3 g/dL (ref 13.0–17.0)
Lymphocytes Relative: 18 %
Lymphs Abs: 3.6 10*3/uL (ref 0.7–4.0)
MCH: 30 pg (ref 26.0–34.0)
MCHC: 35.5 g/dL (ref 30.0–36.0)
MCV: 84.5 fL (ref 78.0–100.0)
MONO ABS: 1.2 10*3/uL — AB (ref 0.1–1.0)
Monocytes Relative: 6 %
Neutro Abs: 14.6 10*3/uL — ABNORMAL HIGH (ref 1.7–7.7)
Neutrophils Relative %: 72 %
PLATELETS: 304 10*3/uL (ref 150–400)
RBC: 5.43 MIL/uL (ref 4.22–5.81)
RDW: 13.4 % (ref 11.5–15.5)
WBC: 20 10*3/uL — AB (ref 4.0–10.5)

## 2015-12-28 LAB — URINE MICROSCOPIC-ADD ON

## 2015-12-28 LAB — URINALYSIS, ROUTINE W REFLEX MICROSCOPIC
Bilirubin Urine: NEGATIVE
Glucose, UA: NEGATIVE mg/dL
Ketones, ur: NEGATIVE mg/dL
LEUKOCYTES UA: NEGATIVE
NITRITE: NEGATIVE
PH: 5 (ref 5.0–8.0)
Protein, ur: NEGATIVE mg/dL
SPECIFIC GRAVITY, URINE: 1.027 (ref 1.005–1.030)

## 2015-12-28 LAB — BASIC METABOLIC PANEL
ANION GAP: 11 (ref 5–15)
BUN: 16 mg/dL (ref 6–20)
CO2: 22 mmol/L (ref 22–32)
Calcium: 9.4 mg/dL (ref 8.9–10.3)
Chloride: 104 mmol/L (ref 101–111)
Creatinine, Ser: 1.45 mg/dL — ABNORMAL HIGH (ref 0.61–1.24)
GFR calc Af Amer: >60 mL/min (ref >60)
GLUCOSE: 99 mg/dL (ref 65–99)
POTASSIUM: 4.3 mmol/L (ref 3.5–5.1)
SODIUM: 137 mmol/L (ref 135–145)

## 2015-12-28 MED ORDER — ONDANSETRON 4 MG PO TBDP: 4.0000 mg | ORAL_TABLET | Freq: Three times a day (TID) | ORAL | 0 refills | Status: DC | PRN

## 2015-12-28 MED ORDER — HYDROMORPHONE HCL 2 MG/ML IJ SOLN
1.0000 mg | Freq: Once | INTRAMUSCULAR | Status: AC
  Administered 2015-12-28: 1 mg via INTRAVENOUS
  Filled 2015-12-28: qty 1

## 2015-12-28 MED ORDER — TAMSULOSIN HCL 0.4 MG PO CAPS: 0.4000 mg | ORAL_CAPSULE | Freq: Two times a day (BID) | ORAL | 0 refills | Status: DC

## 2015-12-28 MED ORDER — IBUPROFEN 800 MG PO TABS: 800.0000 mg | ORAL_TABLET | Freq: Three times a day (TID) | ORAL | 0 refills | Status: DC

## 2015-12-28 MED ORDER — OXYCODONE-ACETAMINOPHEN 5-325 MG PO TABS: 1.0000 | ORAL_TABLET | ORAL | 0 refills | Status: DC | PRN

## 2015-12-28 MED ORDER — KETOROLAC TROMETHAMINE 30 MG/ML IJ SOLN
30.0000 mg | Freq: Once | INTRAMUSCULAR | Status: AC
  Administered 2015-12-28: 30 mg via INTRAVENOUS
  Filled 2015-12-28: qty 1

## 2015-12-28 MED ORDER — FENTANYL CITRATE (PF) 100 MCG/2ML IJ SOLN
100.0000 ug | Freq: Once | INTRAMUSCULAR | Status: AC
  Administered 2015-12-28: 100 ug via INTRAVENOUS
  Filled 2015-12-28: qty 2

## 2015-12-28 NOTE — Discharge Instructions (Signed)
Please obtain all of your results from medical records or have your doctors office obtain the results - share them with your doctor - you should be seen at your doctors office in the next 2 days. Call today to arrange your follow up. Take the medications as prescribed. Please review all of the medicines and only take them if you do not have an allergy to them. Please be aware that if you are taking birth control pills, taking other prescriptions, ESPECIALLY ANTIBIOTICS may make the birth control ineffective - if this is the case, either do not engage in sexual activity or use alternative methods of birth control such as condoms until you have finished the medicine and your family doctor says it is OK to restart them. If you are on a blood thinner such as COUMADIN, be aware that any other medicine that you take may cause the coumadin to either work too much, or not enough - you should have your coumadin level rechecked in next 7 days if this is the case.  ?  It is also a possibility that you have an allergic reaction to any of the medicines that you have been prescribed - Everybody reacts differently to medications and while MOST people have no trouble with most medicines, you may have a reaction such as nausea, vomiting, rash, swelling, shortness of breath. If this is the case, please stop taking the medicine immediately and contact your physician.  ?  You should return to the ER if you develop severe or worsening symptoms.   Motrin 800mg  3 times daily for pain Percocet 1-2 tablets every 6 hours for severe pain Zofran every 6 hours as needed for nausea Flomax twice daily.

## 2015-12-28 NOTE — ED Notes (Signed)
Dr. Hyacinth MeekerMiller is aware of pt. 's pain level.

## 2015-12-28 NOTE — ED Notes (Signed)
Pt bladder scanned for 60 mL.

## 2015-12-28 NOTE — ED Provider Notes (Signed)
[facility]-EMERGENCY DEPT Provider Note   CSN: [id number] Arrival date & time: 12/28/15  1408     History   Chief Complaint Chief Complaint  Patient presents with  . Flank Pain    HPI Raymond Chapman is a 30 y.o. male.  HPI  The patient is a 30 year old male, he has a known history of prior kidney stones, he presents to the hospital after having acute onset of pain this morning when he woke up, this has been intermittent throughout the day, nothing seems to make this better or worse, he will come on spontaneously and cause severe pain that is associated with nausea and vomiting as well as diaphoresis. He denies any fevers or chills but has had some dark-colored urine the last couple of days. He does have a prior history of kidney stones - he has passed these spontaneously in the past, he has not required anything other than lithotripsy. This was last year. Pain radiates from the left flank to the left groin, no swelling of the testicle scrotum, no penile drainage  Past Medical History:  Diagnosis Date  . Left ureteral stone     Patient Active Problem List   Diagnosis Date Noted  . Left ureteral stone 12/24/2014    Past Surgical History:  Procedure Laterality Date  . CYSTOSCOPY WITH RETROGRADE PYELOGRAM, URETEROSCOPY AND STENT PLACEMENT Left 01/19/2015   Procedure: CYSTOSCOPY WITH LEFT RETROGRADE PYELOGRAM, URETEROSCOPY, BASKETING OF STONES AND STENT EXCHANGE;  Surgeon: Sebastian Acheheodore Manny, MD;  [facility] [place];  Service: Urology;  Laterality: Left;  . CYSTOSCOPY WITH URETEROSCOPY AND STENT PLACEMENT Left 12/24/2014   Procedure: CYSTOSCOPY WITH URETEROSCOPY AND STENT PLACEMENT;  Surgeon: Jerilee FieldMatthew Eskridge, MD;  [facility];  Service: Urology;  Laterality: Left;       Home Medications    Prior to Admission medications   Medication Sig Start Date End Date Taking? Authorizing Provider  ibuprofen (ADVIL,MOTRIN) 800 MG tablet Take 1 tablet (800 mg total) by mouth 3  (three) times daily. 12/28/15   Eber HongBrian Makenzye Troutman, MD  ondansetron (ZOFRAN ODT) 4 MG disintegrating tablet Take 1 tablet (4 mg total) by mouth every 8 (eight) hours as needed for nausea. 12/28/15   Eber HongBrian Marlen Mollica, MD  oxyCODONE-acetaminophen (PERCOCET) 5-325 MG tablet Take 1 tablet by mouth every 4 (four) hours as needed. 12/28/15   Eber HongBrian Niamya Vittitow, MD  tamsulosin (FLOMAX) 0.4 MG CAPS capsule Take 1 capsule (0.4 mg total) by mouth 2 (two) times daily. 12/28/15   Eber HongBrian Lindamarie Maclachlan, MD    Family History History reviewed. No pertinent family history.  Social History Social History  Substance Use Topics  . Smoking status: Current Every Day Smoker    Packs/day: 0.50    Types: Cigarettes  . Smokeless tobacco: Former NeurosurgeonUser  . Alcohol use No     Allergies   Demerol [meperidine]   Review of Systems Review of Systems  All other systems reviewed and are negative.    Physical Exam Updated Vital Signs BP 145/89   Pulse 101   Temp 98 F (36.7 C) (Oral)   Resp 20   SpO2 95%   Physical Exam  Constitutional: He appears well-developed. He appears distressed.  HENT:  Head: Normocephalic and atraumatic.  Mouth/Throat: Oropharynx is clear and moist. No oropharyngeal exudate.  Eyes: Conjunctivae and EOM are normal. Pupils are equal, round, and reactive to light. Right eye exhibits no discharge. Left eye exhibits no discharge. No scleral icterus.  Neck: Normal range of motion. Neck supple. No JVD  present. No thyromegaly present.  Cardiovascular: Normal rate, regular rhythm, normal heart sounds and intact distal pulses.  Exam reveals no gallop and no friction rub.   No murmur heard. Pulmonary/Chest: Effort normal and breath sounds normal. No respiratory distress. He has no wheezes. He has no rales.  Abdominal: Soft. Bowel sounds are normal. He exhibits no distension and no mass. There is no tenderness.  Musculoskeletal: Normal range of motion. He exhibits no edema or tenderness.  Lymphadenopathy:    He  has no cervical adenopathy.  Neurological: He is alert. Coordination normal.  Skin: Skin is warm and dry. No rash noted. No erythema.  Psychiatric: He has a normal mood and affect. His behavior is normal.  Nursing note and vitals reviewed.    ED Treatments / Results  Labs (all labs ordered are listed, but only abnormal results are displayed) Labs Reviewed  URINALYSIS, ROUTINE W REFLEX MICROSCOPIC (NOT AT [facility]) - Abnormal; Notable for the following:       Result Value   Color, Urine AMBER (*)    APPearance CLOUDY (*)    Hgb urine dipstick LARGE (*)    All other components within normal limits  URINE MICROSCOPIC-ADD ON - Abnormal; Notable for the following:    Squamous Epithelial / LPF 0-5 (*)    Bacteria, UA MANY (*)    Crystals CA OXALATE CRYSTALS (*)    All other components within normal limits  CBC WITH DIFFERENTIAL/PLATELET - Abnormal; Notable for the following:    WBC 20.0 (*)    Neutro Abs 14.6 (*)    Monocytes Absolute 1.2 (*)    All other components within normal limits  BASIC METABOLIC PANEL - Abnormal; Notable for the following:    Creatinine, Ser 1.45 (*)    All other components within normal limits    EKG  EKG Interpretation None       Radiology Ct Renal Stone Study  Result Date: 12/28/2015 CLINICAL DATA:  LEFT flank pain since this morning, getting worse, history kidney stones EXAM: CT ABDOMEN AND PELVIS WITHOUT CONTRAST TECHNIQUE: Multidetector CT imaging of the abdomen and pelvis was performed following the standard protocol without IV contrast. Sagittal and coronal MPR images reconstructed from axial data set. Oral contrast not administered for this indication. COMPARISON:  05/18/2015 FINDINGS: Lower chest: Minimal atelectasis LEFT lower lobe. Hepatobiliary: Gallbladder and liver normal appearance Pancreas: Normal appearance Spleen: Normal appearance Adrenals/Urinary Tract: Adrenal glands normal appearance. LEFT hydronephrosis and hydroureter secondary to a  3 mm proximal LEFT ureteral calculus image 55. Tiny nonobstructing calculus mid RIGHT kidney. RIGHT ureter and remainder of LEFT ureter decompressed. Bladder unremarkable. Stomach/Bowel: Normal appendix. Stomach and bowel loops normal appearance for technique. Vascular/Lymphatic: Unremarkable Reproductive: N/A Other: No free air or free fluid.  No hernia. Musculoskeletal: Degenerative changes lower thoracic spine with old height losses of several adjacent vertebra. IMPRESSION: Mild LEFT hydronephrosis secondary to a 3 mm proximal LEFT ureteral calculus. Tiny nonobstructing calculus RIGHT kidney. Electronically Signed   By: Ulyses Southward M.D.   On: 12/28/2015 17:32    Procedures Procedures (including critical care time)  Medications Ordered in ED Medications  HYDROmorphone (DILAUDID) injection 1 mg (1 mg Intravenous Given 12/28/15 1631)  ketorolac (TORADOL) 30 MG/ML injection 30 mg (30 mg Intravenous Given 12/28/15 1632)  fentaNYL (SUBLIMAZE) injection 100 mcg (100 mcg Intravenous Given 12/28/15 1729)     Initial Impression / Assessment and Plan / ED Course  I have reviewed the triage vital signs and the nursing notes.  Pertinent labs & imaging results that were available during my care of the patient were reviewed by me and considered in my medical decision making (see chart for details).  Clinical Course     Though the patient initially appeared in distress it was likely from the ureteral colic, he impressively significant with medications and ultimately a 3 mm mid ureteral kidney stone was seen, there was no [place] hitting factors, he had hematuria but no infection, he had a leukocytosis but in this case is likely reactive to going on and not a sign of underlying infection. The patient was informed of his results, he will be given medications as below for discharge and encouraged follow-up as needed if he does not pass the stone. He expresses understanding  Final Clinical Impressions(s) / ED  Diagnoses   Final diagnoses:  Ureterolithiasis    New Prescriptions New Prescriptions   IBUPROFEN (ADVIL,MOTRIN) 800 MG TABLET    Take 1 tablet (800 mg total) by mouth 3 (three) times daily.   ONDANSETRON (ZOFRAN ODT) 4 MG DISINTEGRATING TABLET    Take 1 tablet (4 mg total) by mouth every 8 (eight) hours as needed for nausea.   OXYCODONE-ACETAMINOPHEN (PERCOCET) 5-325 MG TABLET    Take 1 tablet by mouth every 4 (four) hours as needed.   TAMSULOSIN (FLOMAX) 0.4 MG CAPS CAPSULE    Take 1 capsule (0.4 mg total) by mouth 2 (two) times daily.     Eber HongBrian Tollie Canada, MD 12/28/15 1901

## 2015-12-28 NOTE — ED Triage Notes (Addendum)
Pt presents with c/o flank pain. The pain began when he woke this morning. The pain radiates into his L groin. He reports dark urine several days ago. He reports nausea and trouble voiding today. He has a history of kidney stones and this pain feels the same. He is pacing around the room and unable to sit still

## 2016-12-20 IMAGING — CT CT RENAL STONE PROTOCOL
2 of 4 series · 17 of 46 positions shown, 19 images · non-contrast
Comparison: CT dated 05/22/2009

CLINICAL DATA: 28-year-old male with left all flank pain and
hematuria

EXAM:
CT ABDOMEN AND PELVIS WITHOUT CONTRAST
TECHNIQUE: Multidetector CT imaging of the abdomen and pelvis was performed
following the standard protocol without IV contrast.

[Series 2: stone study 5.0 i30f 1 · axial · 0.88mm/px · z∈[+778,+1223]mm · 14 of 99 slices shown, 16 images]
[im 5/99  soft-tissue]
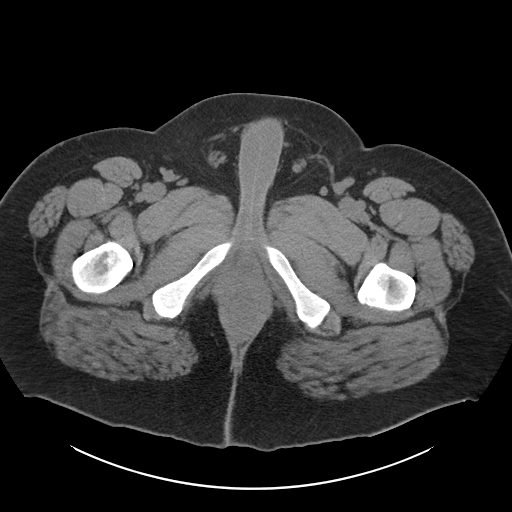
[im 5/99  bone]
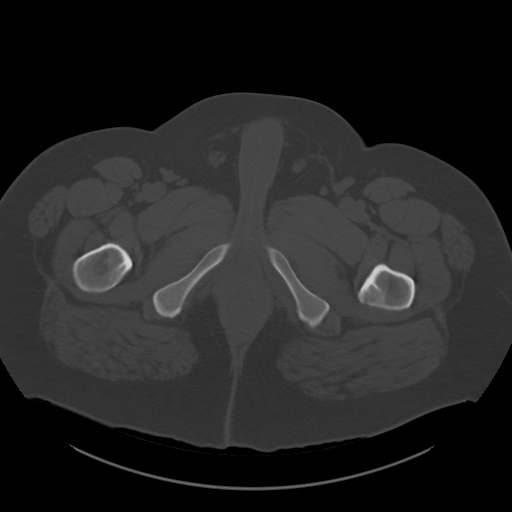
[im 13/99  soft-tissue]
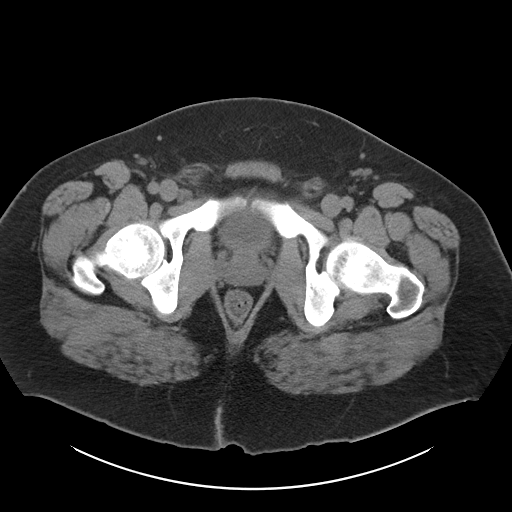
[im 21/99  soft-tissue]
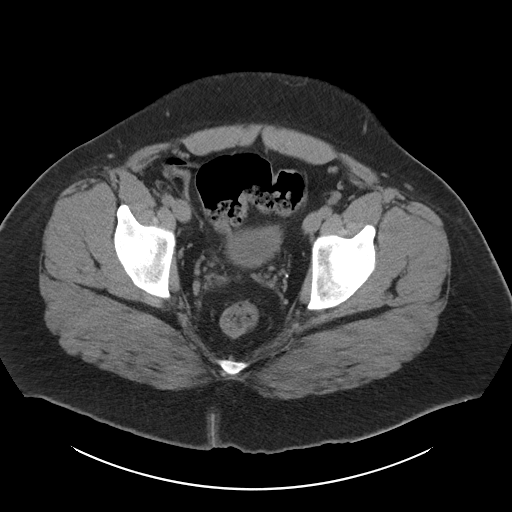
[im 25/99  soft-tissue]
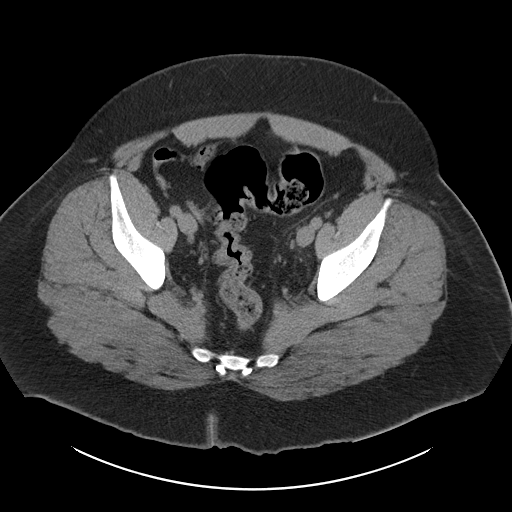
[im 33/99  soft-tissue]
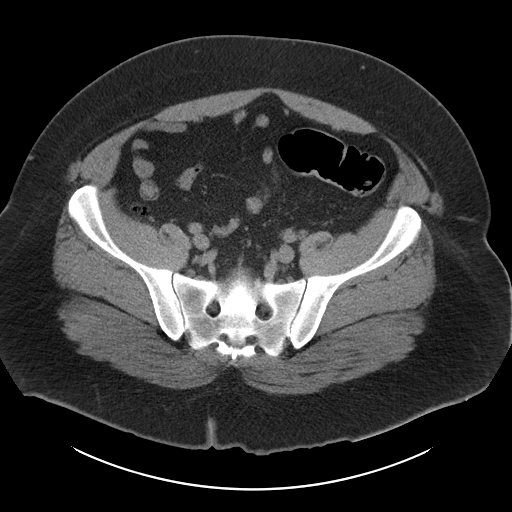
[im 41/99  soft-tissue]
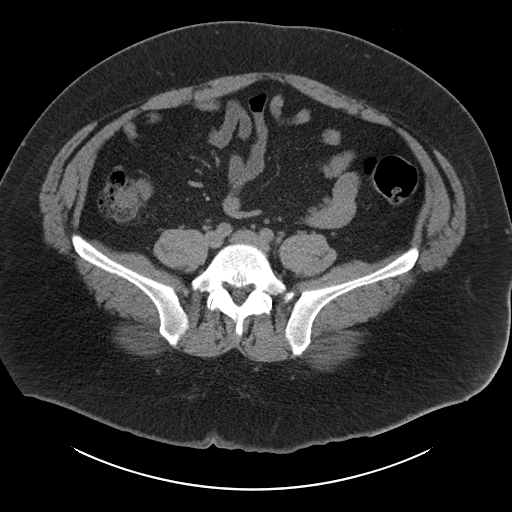
[im 45/99  soft-tissue]
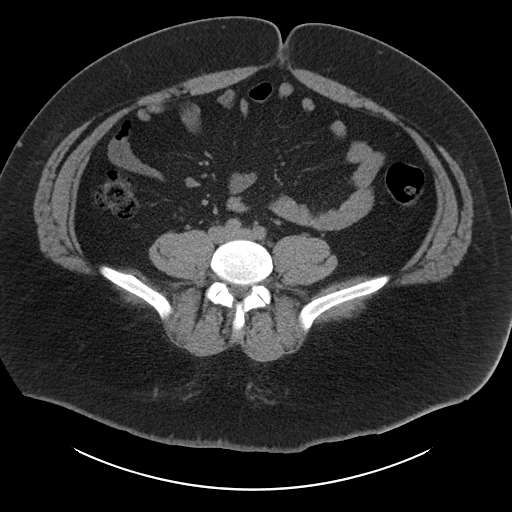
[im 54/99  soft-tissue]
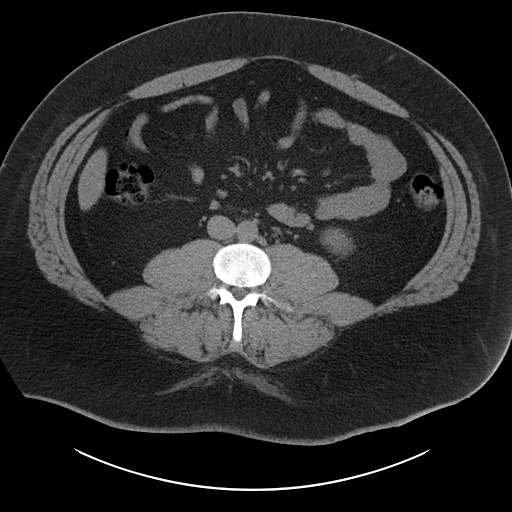
[im 58/99  soft-tissue]
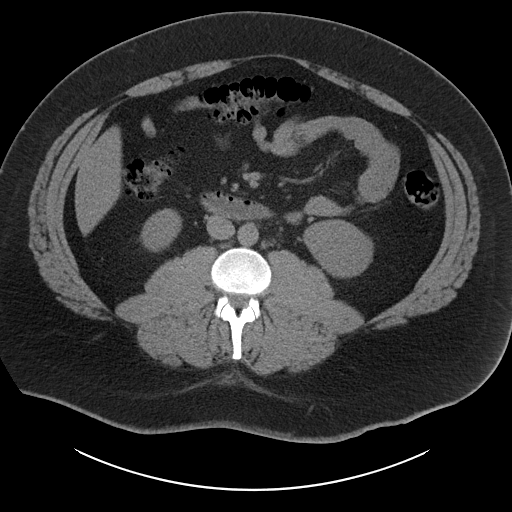
[im 58/99  bone]
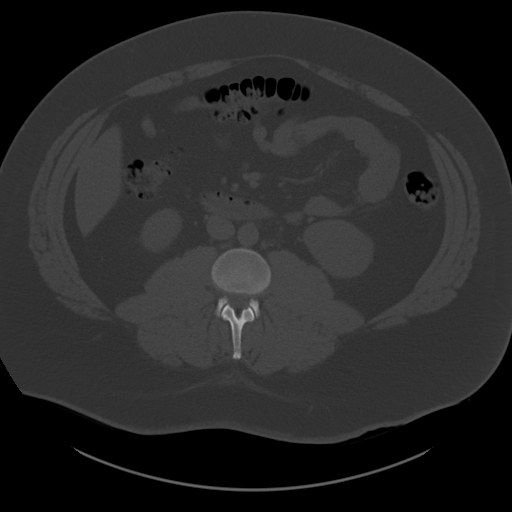
[im 66/99  soft-tissue]
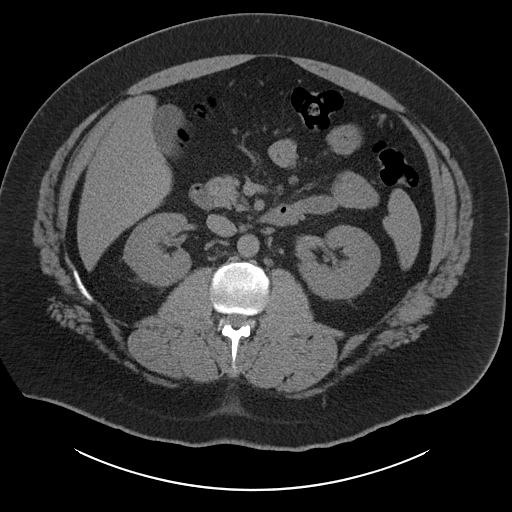
[im 74/99  soft-tissue]
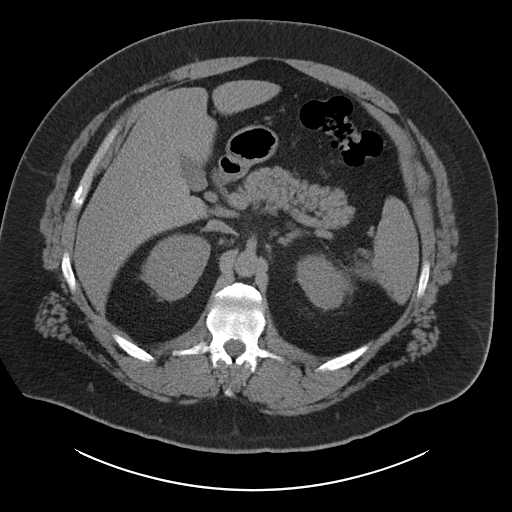
[im 78/99  soft-tissue]
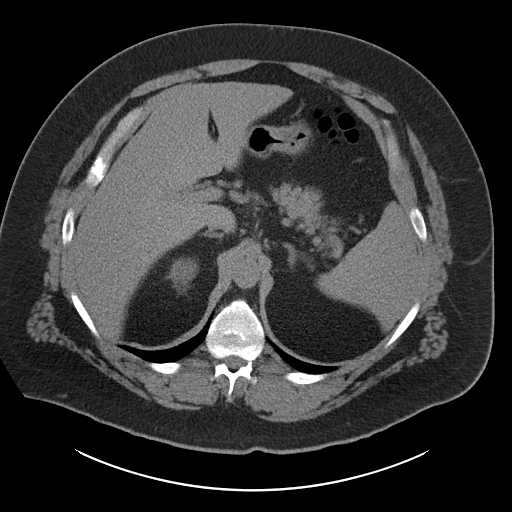
[im 86/99  soft-tissue]
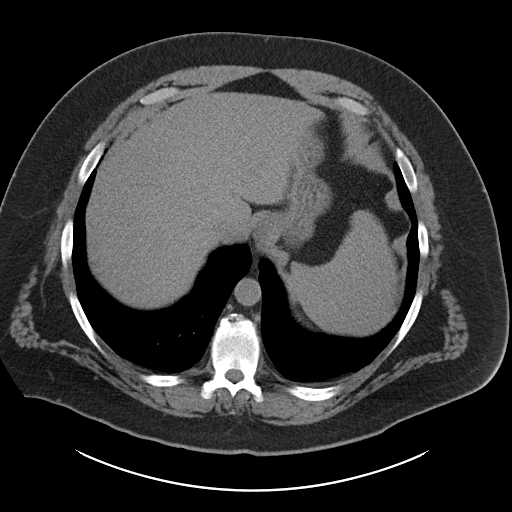
[im 94/99  soft-tissue]
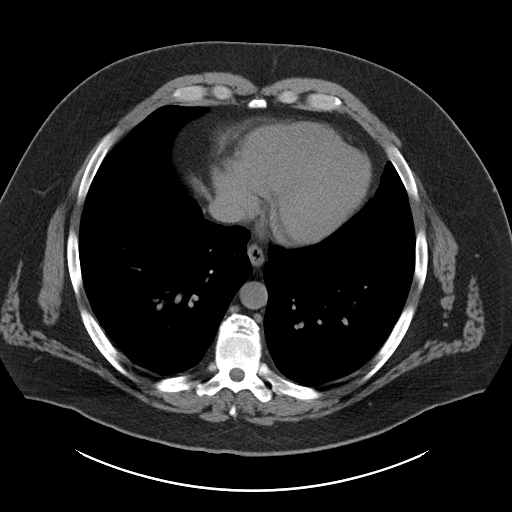

[Series 5: coronal soft tissue · coronal · 0.99mm/px · 3 of 110 slices shown]
[im 37/110  soft-tissue]
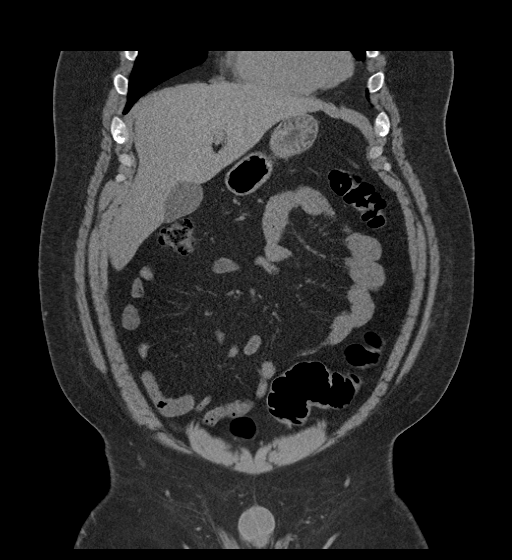
[im 49/110  soft-tissue]
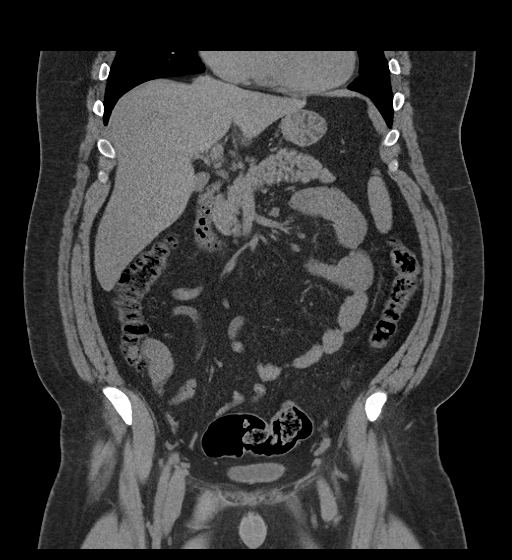
[im 61/110  soft-tissue]
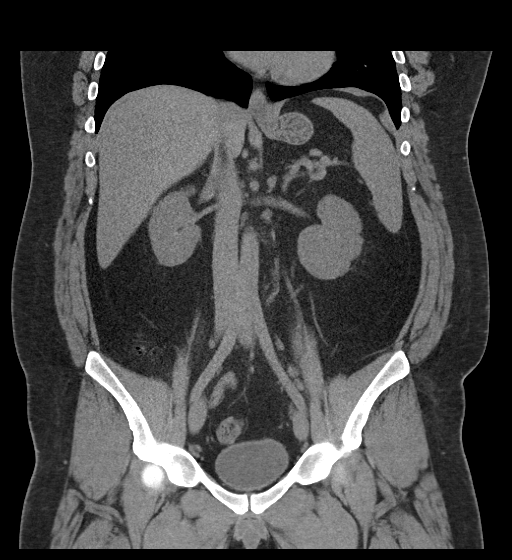

[17 of 46 positions shown; findings below may reference images not displayed]

FINDINGS: The visualized lung bases are clear. No intra-abdominal free air or
free fluid.

The liver, gallbladder, pancreas, spleen, adrenal glands, kidneys,
visualized ureters and urinary bladder appear unremarkable the
prostate and seminal vesicles are grossly unremarkable.

There is no evidence of bowel obstruction or inflammation. Normal
appendix.

The visualized abdominal aorta and IVC appear grossly unremarkable
on this noncontrast study. There is mild haziness of the mesentery
with multiple top-normal lymph nodes with a "misty mesentery"
appearance. This finding is nonspecific but may be related to
underlying inflammatory/infectious etiology. The soft tissues of the
abdominal wall appear unremarkable. Mild degenerative changes of the
spine. Lower thoracic compression fractures most prominent at T10
with mild anterior wedging, likely old. No definite acute fracture.
IMPRESSION: No acute intra-abdominal or pelvic pathology. No hydronephrosis or
nephrolithiasis.

## 2018-12-14 ENCOUNTER — Other Ambulatory Visit: Payer: Self-pay

## 2018-12-14 ENCOUNTER — Emergency Department (HOSPITAL_COMMUNITY): Payer: Self-pay

## 2018-12-14 ENCOUNTER — Inpatient Hospital Stay (HOSPITAL_COMMUNITY)
Admission: EM | Admit: 2018-12-14 | Discharge: 2018-12-18 | DRG: 190 | Disposition: A | Discharge status: Home / Self Care | Payer: Self-pay | Attending: Family Medicine | Admitting: Family Medicine

## 2018-12-14 ENCOUNTER — Encounter (HOSPITAL_COMMUNITY): Payer: Self-pay

## 2018-12-14 DIAGNOSIS — R0600 Dyspnea, unspecified: Secondary | ICD-10-CM

## 2018-12-14 DIAGNOSIS — R030 Elevated blood-pressure reading, without diagnosis of hypertension: Secondary | ICD-10-CM | POA: Diagnosis present

## 2018-12-14 DIAGNOSIS — J9601 Acute respiratory failure with hypoxia: Secondary | ICD-10-CM

## 2018-12-14 DIAGNOSIS — D72829 Elevated white blood cell count, unspecified: Secondary | ICD-10-CM | POA: Diagnosis present

## 2018-12-14 DIAGNOSIS — Z8709 Personal history of other diseases of the respiratory system: Secondary | ICD-10-CM

## 2018-12-14 DIAGNOSIS — J9690 Respiratory failure, unspecified, unspecified whether with hypoxia or hypercapnia: Secondary | ICD-10-CM | POA: Diagnosis present

## 2018-12-14 DIAGNOSIS — Z87442 Personal history of urinary calculi: Secondary | ICD-10-CM

## 2018-12-14 DIAGNOSIS — Z20828 Contact with and (suspected) exposure to other viral communicable diseases: Secondary | ICD-10-CM | POA: Diagnosis present

## 2018-12-14 DIAGNOSIS — Z6841 Body Mass Index (BMI) 40.0 and over, adult: Secondary | ICD-10-CM

## 2018-12-14 DIAGNOSIS — F17210 Nicotine dependence, cigarettes, uncomplicated: Secondary | ICD-10-CM | POA: Diagnosis present

## 2018-12-14 DIAGNOSIS — E871 Hypo-osmolality and hyponatremia: Secondary | ICD-10-CM | POA: Diagnosis present

## 2018-12-14 DIAGNOSIS — J441 Chronic obstructive pulmonary disease with (acute) exacerbation: Principal | ICD-10-CM | POA: Diagnosis present

## 2018-12-14 DIAGNOSIS — I071 Rheumatic tricuspid insufficiency: Secondary | ICD-10-CM | POA: Diagnosis present

## 2018-12-14 DIAGNOSIS — Z885 Allergy status to narcotic agent status: Secondary | ICD-10-CM

## 2018-12-14 LAB — LACTIC ACID, PLASMA: Lactic Acid, Venous: 1.5 mmol/L (ref 0.5–1.9)

## 2018-12-14 LAB — COMPREHENSIVE METABOLIC PANEL
ALT: 31 U/L (ref 0–44)
AST: 30 U/L (ref 15–41)
Albumin: 4.5 g/dL (ref 3.5–5.0)
Alkaline Phosphatase: 81 U/L (ref 38–126)
Anion gap: 12 (ref 5–15)
BUN: 11 mg/dL (ref 6–20)
CO2: 24 mmol/L (ref 22–32)
Calcium: 9.2 mg/dL (ref 8.9–10.3)
Chloride: 97 mmol/L — ABNORMAL LOW (ref 98–111)
Creatinine, Ser: 0.92 mg/dL (ref 0.61–1.24)
GFR calc Af Amer: >60 mL/min (ref >60)
GFR calc non Af Amer: >60 mL/min (ref >60)
Glucose, Bld: 103 mg/dL — ABNORMAL HIGH (ref 70–99)
Potassium: 4.1 mmol/L (ref 3.5–5.1)
Sodium: 133 mmol/L — ABNORMAL LOW (ref 135–145)
Total Bilirubin: 1 mg/dL (ref 0.3–1.2)
Total Protein: 8.5 g/dL — ABNORMAL HIGH (ref 6.5–8.1)

## 2018-12-14 LAB — INFLUENZA PANEL BY PCR (TYPE A & B)
Influenza A By PCR: NEGATIVE
Influenza B By PCR: NEGATIVE

## 2018-12-14 LAB — HIV ANTIBODY (ROUTINE TESTING W REFLEX): HIV Screen 4th Generation wRfx: NONREACTIVE

## 2018-12-14 LAB — BRAIN NATRIURETIC PEPTIDE: B Natriuretic Peptide: 38.5 pg/mL (ref 0.0–100.0)

## 2018-12-14 LAB — CBC WITH DIFFERENTIAL/PLATELET
Abs Immature Granulocytes: 0.08 10*3/uL — ABNORMAL HIGH (ref 0.00–0.07)
Basophils Absolute: 0.1 10*3/uL (ref 0.0–0.1)
Basophils Relative: 1 %
Eosinophils Absolute: 0.4 10*3/uL (ref 0.0–0.5)
Eosinophils Relative: 3 %
HCT: 50.2 % (ref 39.0–52.0)
Hemoglobin: 16.4 g/dL (ref 13.0–17.0)
Immature Granulocytes: 1 %
Lymphocytes Relative: 14 %
Lymphs Abs: 1.6 10*3/uL (ref 0.7–4.0)
MCH: 28.5 pg (ref 26.0–34.0)
MCHC: 32.7 g/dL (ref 30.0–36.0)
MCV: 87.2 fL (ref 80.0–100.0)
Monocytes Absolute: 0.9 10*3/uL (ref 0.1–1.0)
Monocytes Relative: 7 %
Neutro Abs: 8.8 10*3/uL — ABNORMAL HIGH (ref 1.7–7.7)
Neutrophils Relative %: 74 %
Platelets: 265 10*3/uL (ref 150–400)
RBC: 5.76 MIL/uL (ref 4.22–5.81)
RDW: 13.2 % (ref 11.5–15.5)
WBC: 11.9 10*3/uL — ABNORMAL HIGH (ref 4.0–10.5)
nRBC: 0 % (ref 0.0–0.2)

## 2018-12-14 LAB — D-DIMER, QUANTITATIVE: D-Dimer, Quant: 0.34 ug/mL-FEU (ref 0.00–0.50)

## 2018-12-14 LAB — FIBRINOGEN: Fibrinogen: 454 mg/dL (ref 210–475)

## 2018-12-14 LAB — TRIGLYCERIDES: Triglycerides: 123 mg/dL (ref <150)

## 2018-12-14 LAB — FERRITIN: Ferritin: 90 ng/mL (ref 24–336)

## 2018-12-14 LAB — C-REACTIVE PROTEIN: CRP: 4.9 mg/dL — ABNORMAL HIGH (ref <1.0)

## 2018-12-14 LAB — PROCALCITONIN: Procalcitonin: <0.1 ng/mL

## 2018-12-14 LAB — LACTATE DEHYDROGENASE: LDH: 178 U/L (ref 98–192)

## 2018-12-14 LAB — SARS CORONAVIRUS 2 (TAT 6-24 HRS): SARS Coronavirus 2: NEGATIVE

## 2018-12-14 MED ORDER — ALBUTEROL SULFATE HFA 108 (90 BASE) MCG/ACT IN AERS
4.0000 | INHALATION_SPRAY | Freq: Once | RESPIRATORY_TRACT | Status: AC
  Administered 2018-12-14: 4 via RESPIRATORY_TRACT

## 2018-12-14 MED ORDER — SODIUM CHLORIDE 0.9% FLUSH: 3.0000 mL | INTRAVENOUS | Status: DC | PRN

## 2018-12-14 MED ORDER — POTASSIUM CHLORIDE CRYS ER 20 MEQ PO TBCR
20.0000 meq | EXTENDED_RELEASE_TABLET | Freq: Once | ORAL | Status: AC
  Administered 2018-12-14: 20 meq via ORAL
  Filled 2018-12-14: qty 1

## 2018-12-14 MED ORDER — ACETAMINOPHEN 650 MG RE SUPP: 650.0000 mg | Freq: Four times a day (QID) | RECTAL | Status: DC | PRN

## 2018-12-14 MED ORDER — IPRATROPIUM-ALBUTEROL 0.5-2.5 (3) MG/3ML IN SOLN
3.0000 mL | Freq: Three times a day (TID) | RESPIRATORY_TRACT | Status: DC
  Administered 2018-12-15 – 2018-12-18 (×10): 3 mL via RESPIRATORY_TRACT
  Filled 2018-12-14 (×10): qty 3

## 2018-12-14 MED ORDER — SODIUM CHLORIDE 0.9 % IV SOLN
250.0000 mL | INTRAVENOUS | Status: DC | PRN
  Administered 2018-12-14: 250 mL via INTRAVENOUS

## 2018-12-14 MED ORDER — METHYLPREDNISOLONE SODIUM SUCC 125 MG IJ SOLR
125.0000 mg | Freq: Once | INTRAMUSCULAR | Status: AC
  Administered 2018-12-14: 125 mg via INTRAVENOUS
  Filled 2018-12-14: qty 2

## 2018-12-14 MED ORDER — SODIUM CHLORIDE 0.9% FLUSH
3.0000 mL | Freq: Two times a day (BID) | INTRAVENOUS | Status: DC
  Administered 2018-12-14 – 2018-12-17 (×7): 3 mL via INTRAVENOUS

## 2018-12-14 MED ORDER — ACETAMINOPHEN 325 MG PO TABS: 650.0000 mg | ORAL_TABLET | Freq: Four times a day (QID) | ORAL | Status: DC | PRN

## 2018-12-14 MED ORDER — GUAIFENESIN ER 600 MG PO TB12
600.0000 mg | ORAL_TABLET | Freq: Two times a day (BID) | ORAL | Status: DC
  Administered 2018-12-14 – 2018-12-18 (×8): 600 mg via ORAL
  Filled 2018-12-14 (×8): qty 1

## 2018-12-14 MED ORDER — ENOXAPARIN SODIUM 40 MG/0.4ML ~~LOC~~ SOLN
40.0000 mg | SUBCUTANEOUS | Status: DC
  Administered 2018-12-14 – 2018-12-15 (×2): 40 mg via SUBCUTANEOUS
  Filled 2018-12-14 (×2): qty 0.4

## 2018-12-14 MED ORDER — ALBUTEROL SULFATE HFA 108 (90 BASE) MCG/ACT IN AERS
4.0000 | INHALATION_SPRAY | Freq: Once | RESPIRATORY_TRACT | Status: AC
  Administered 2018-12-14: 10:00:00 4 via RESPIRATORY_TRACT
  Filled 2018-12-14: qty 6.7

## 2018-12-14 MED ORDER — METHYLPREDNISOLONE SODIUM SUCC 125 MG IJ SOLR
60.0000 mg | Freq: Four times a day (QID) | INTRAMUSCULAR | Status: DC
  Administered 2018-12-14 – 2018-12-15 (×4): 60 mg via INTRAVENOUS
  Filled 2018-12-14 (×4): qty 2

## 2018-12-14 MED ORDER — IPRATROPIUM-ALBUTEROL 0.5-2.5 (3) MG/3ML IN SOLN
3.0000 mL | Freq: Four times a day (QID) | RESPIRATORY_TRACT | Status: DC
  Administered 2018-12-14: 3 mL via RESPIRATORY_TRACT
  Filled 2018-12-14: qty 3

## 2018-12-14 MED ORDER — BENZONATATE 100 MG PO CAPS
100.0000 mg | ORAL_CAPSULE | Freq: Three times a day (TID) | ORAL | Status: DC | PRN
  Administered 2018-12-14 – 2018-12-18 (×4): 100 mg via ORAL
  Filled 2018-12-14 (×4): qty 1

## 2018-12-14 MED ORDER — AZITHROMYCIN 250 MG PO TABS
500.0000 mg | ORAL_TABLET | Freq: Every day | ORAL | Status: DC
  Administered 2018-12-14 – 2018-12-18 (×5): 500 mg via ORAL
  Filled 2018-12-14 (×5): qty 2

## 2018-12-14 MED ORDER — ONDANSETRON HCL 4 MG/2ML IJ SOLN: 4.0000 mg | Freq: Four times a day (QID) | INTRAMUSCULAR | Status: DC | PRN

## 2018-12-14 MED ORDER — MAGNESIUM SULFATE 2 GM/50ML IV SOLN
2.0000 g | Freq: Once | INTRAVENOUS | Status: AC
  Administered 2018-12-14: 2 g via INTRAVENOUS
  Filled 2018-12-14: qty 50

## 2018-12-14 MED ORDER — FUROSEMIDE 10 MG/ML IJ SOLN
20.0000 mg | Freq: Two times a day (BID) | INTRAMUSCULAR | Status: AC
  Administered 2018-12-14 – 2018-12-15 (×2): 20 mg via INTRAVENOUS
  Filled 2018-12-14 (×2): qty 2

## 2018-12-14 MED ORDER — HYDRALAZINE HCL 10 MG PO TABS
10.0000 mg | ORAL_TABLET | Freq: Four times a day (QID) | ORAL | Status: DC | PRN
  Administered 2018-12-14 – 2018-12-15 (×2): 10 mg via ORAL
  Filled 2018-12-14 (×3): qty 1

## 2018-12-14 MED ORDER — TRAMADOL HCL 50 MG PO TABS
50.0000 mg | ORAL_TABLET | Freq: Three times a day (TID) | ORAL | Status: DC | PRN
  Administered 2018-12-15: 50 mg via ORAL
  Filled 2018-12-14: qty 1

## 2018-12-14 MED ORDER — ONDANSETRON HCL 4 MG PO TABS: 4.0000 mg | ORAL_TABLET | Freq: Four times a day (QID) | ORAL | Status: DC | PRN

## 2018-12-14 MED ORDER — SODIUM CHLORIDE 0.9 % IV SOLN
INTRAVENOUS | Status: DC
  Administered 2018-12-14: 10:00:00 via INTRAVENOUS

## 2018-12-14 MED ORDER — ALBUTEROL SULFATE (2.5 MG/3ML) 0.083% IN NEBU: 2.5000 mg | INHALATION_SOLUTION | RESPIRATORY_TRACT | Status: DC | PRN

## 2018-12-14 NOTE — ED Notes (Signed)
ED TO INPATIENT HANDOFF REPORT  ED Nurse Name and Phone #: [phone]  S Name/Age/Gender Raymond Chapman 33 y.o. male Room/Bed: WA03/WA03  Code Status   Code Status: Prior  Home/SNF/Other Home Patient oriented to: self, place, time and situation Is this baseline? Yes   Triage Complete: Triage complete  Chief Complaint diff breathing  Triage Note Pt arrives POV with complaints of SHOB that began yesterday but has worsened today. Pt states he is unable to take a deep breath. Pt reports minimal chest pain. Pt denies COVID exposure, fevers,cough   Allergies Allergies  Allergen Reactions  . Demerol [Meperidine] Other (See Comments)    hyperactivity    Level of Care/Admitting Diagnosis ED Disposition    ED Disposition Condition Comment   Admit  [facility] [[id number]]  Level of Care: Telemetry [5]  Admit to tele based on following criteria: Complex arrhythmia (Bradycardia/Tachycardia)  Covid Evaluation: Confirmed COVID Negative  Diagnosis: COPD exacerbation [facility](HCC) [[id number]][[id number]]  Admitting Physician: Alba CoryEGALADO, BELKYS A [phone][[id number]]  Attending Physician: Alba CoryEGALADO, BELKYS A [phone][[id number]]  Estimated length of stay: 3 - 4 days  Certification:: I certify this patient will need inpatient services for at least 2 midnights  PT Class (Do Not Modify): Inpatient [101]  PT Acc Code (Do Not Modify): Private [1]       B Medical/Surgery History Past Medical History:  Diagnosis Date  . Left ureteral stone    Past Surgical History:  Procedure Laterality Date  . CYSTOSCOPY WITH RETROGRADE PYELOGRAM, URETEROSCOPY AND STENT PLACEMENT Left 01/19/2015   Procedure: CYSTOSCOPY WITH LEFT RETROGRADE PYELOGRAM, URETEROSCOPY, BASKETING OF STONES AND STENT EXCHANGE;  Surgeon: Sebastian Acheheodore Manny, MD;  [facility] [place];  Service: Urology;  Laterality: Left;  . CYSTOSCOPY WITH URETEROSCOPY AND STENT PLACEMENT Left 12/24/2014   Procedure: CYSTOSCOPY WITH URETEROSCOPY AND  STENT PLACEMENT;  Surgeon: Jerilee FieldMatthew Eskridge, MD;  [facility];  Service: Urology;  Laterality: Left;     A IV Location/Drains/Wounds Patient Lines/Drains/Airways Status   Active Line/Drains/Airways    Name:   Placement date:   Placement time:   Site:   Days:   Peripheral IV 12/14/18 Left Antecubital   12/14/18    0936    Antecubital   less than 1   Peripheral IV 12/14/18 Right Hand   12/14/18    1000    Hand   less than 1   Ureteral Drain/Stent Left ureter 6 Fr.   01/19/15    0957    Left ureter   1425   Incision (Closed) 01/19/15 Penis   01/19/15    0947     1425          Intake/Output Last 24 hours No intake or output data in the 24 hours ending 12/14/18 1526  Labs/Imaging Results for orders placed or performed during the hospital encounter of 12/14/18 (from the past 48 hour(s))  CBC with Differential     Status: Abnormal   Collection Time: 12/14/18  9:43 AM  Result Value Ref Range   WBC 11.9 (H) 4.0 - 10.5 K/uL   RBC 5.76 4.22 - 5.81 MIL/uL   Hemoglobin 16.4 13.0 - 17.0 g/dL   HCT 78.450.2 69.639.0 - 29.552.0 %   MCV 87.2 80.0 - 100.0 fL   MCH 28.5 26.0 - 34.0 pg   MCHC 32.7 30.0 - 36.0 g/dL   RDW 28.413.2 13.211.5 - 44.015.5 %   Platelets 265 150 - 400 K/uL   nRBC 0.0 0.0 - 0.2 %  Neutrophils Relative % 74 %   Neutro Abs 8.8 (H) 1.7 - 7.7 K/uL   Lymphocytes Relative 14 %   Lymphs Abs 1.6 0.7 - 4.0 K/uL   Monocytes Relative 7 %   Monocytes Absolute 0.9 0.1 - 1.0 K/uL   Eosinophils Relative 3 %   Eosinophils Absolute 0.4 0.0 - 0.5 K/uL   Basophils Relative 1 %   Basophils Absolute 0.1 0.0 - 0.1 K/uL   Immature Granulocytes 1 %   Abs Immature Granulocytes 0.08 (H) 0.00 - 0.07 K/uL    Comment: Performed at [facility], [address] [place], [place] [address]  CMET     Status: Abnormal   Collection Time: 12/14/18  9:43 AM  Result Value Ref Range   Sodium 133 (L) 135 - 145 mmol/L   Potassium 4.1 3.5 - 5.1 mmol/L   Chloride 97 (L) 98 - 111 mmol/L   CO2 24 22 -  32 mmol/L   Glucose, Bld 103 (H) 70 - 99 mg/dL   BUN 11 6 - 20 mg/dL   Creatinine, Ser 3.08 0.61 - 1.24 mg/dL   Calcium 9.2 8.9 - 65.7 mg/dL   Total Protein 8.5 (H) 6.5 - 8.1 g/dL   Albumin 4.5 3.5 - 5.0 g/dL   AST 30 15 - 41 U/L   ALT 31 0 - 44 U/L   Alkaline Phosphatase 81 38 - 126 U/L   Total Bilirubin 1.0 0.3 - 1.2 mg/dL   GFR calc non Af Amer >60 >60 mL/min   GFR calc Af Amer >60 >60 mL/min   Anion gap 12 5 - 15    Comment: Performed at [facility], [address] [place], [place] [address]  LACTIC ACID     Status: None   Collection Time: 12/14/18  9:43 AM  Result Value Ref Range   Lactic Acid, Venous 1.5 0.5 - 1.9 mmol/L    Comment: Performed at [facility], [address] [place], [place] [address]  SARS CORONAVIRUS 2 (TAT 6-24 HRS) Nasopharyngeal Nasopharyngeal Swab     Status: None   Collection Time: 12/14/18  9:43 AM   Specimen: Nasopharyngeal Swab  Result Value Ref Range   SARS Coronavirus 2 NEGATIVE NEGATIVE    Comment: (NOTE) SARS-CoV-2 target nucleic acids are NOT DETECTED. The SARS-CoV-2 RNA is generally detectable in upper and lower respiratory specimens during the acute phase of infection. Negative results do not preclude SARS-CoV-2 infection, do not rule out co-infections with other pathogens, and should not be used as the sole basis for treatment or other patient management decisions. Negative results must be combined with clinical observations, patient history, and epidemiological information. The expected result is Negative. Fact Sheet for Patients: HairSlick.no Fact Sheet for Healthcare Providers: quierodirigir.com This test is not yet approved or cleared by the [place] FDA and  has been authorized for detection and/or diagnosis of SARS-CoV-2 by FDA under an Emergency Use Authorization (EUA). This EUA will remain  in effect (meaning this test can be  used) for the duration of the COVID-19 declaration under Section 56 4(b)(1) of the Act, 21 U.S.C. section 360bbb-3(b)(1), unless the authorization is terminated or revoked sooner. Performed at [facility], [address] [place], [place] [address]   D-dimer, quantitative     Status: None   Collection Time: 12/14/18  9:43 AM  Result Value Ref Range   D-Dimer, Quant 0.34 0.00 - 0.50 ug/mL-FEU    Comment: (NOTE) At the  manufacturer cut-off of 0.50 ug/mL FEU, this assay has been documented to exclude PE with a sensitivity and negative predictive value of 97 to 99%.  At this time, this assay has not been approved by the FDA to exclude DVT/VTE. Results should be correlated with clinical presentation. Performed at [facility], [address], [place], [place] [address]   Procalcitonin     Status: None   Collection Time: 12/14/18  9:43 AM  Result Value Ref Range   Procalcitonin <0.10 ng/mL    Comment:        Interpretation: PCT (Procalcitonin) <= 0.5 ng/mL: Systemic infection (sepsis) is not likely. Local bacterial infection is possible. (NOTE)       Sepsis PCT Algorithm           Lower Respiratory Tract                                      Infection PCT Algorithm    ----------------------------     ----------------------------         PCT < 0.25 ng/mL                PCT < 0.10 ng/mL         Strongly encourage             Strongly discourage   discontinuation of antibiotics    initiation of antibiotics    ----------------------------     -----------------------------       PCT 0.25 - 0.50 ng/mL            PCT 0.10 - 0.25 ng/mL               OR       >80% decrease in PCT            Discourage initiation of                                            antibiotics      Encourage discontinuation           of antibiotics    ----------------------------     -----------------------------         PCT >= 0.50 ng/mL              PCT 0.26 - 0.50 ng/mL                AND        <80% decrease in PCT             Encourage initiation of                                             antibiotics       Encourage continuation           of antibiotics    ----------------------------     -----------------------------        PCT >= 0.50 ng/mL                  PCT > 0.50 ng/mL               AND  increase in PCT                  Strongly encourage                                      initiation of antibiotics    Strongly encourage escalation           of antibiotics                                     -----------------------------                                           PCT <= 0.25 ng/mL                                                 OR                                        > 80% decrease in PCT                                     Discontinue / Do not initiate                                             antibiotics Performed at [place] [address], [place], [place] [address]   Lactate dehydrogenase     Status: None   Collection Time: 12/14/18  9:43 AM  Result Value Ref Range   LDH 178 98 - 192 U/L    Comment: Performed at [facility], [place] [address] [place], [place] [address]  Ferritin     Status: None   Collection Time: 12/14/18  9:43 AM  Result Value Ref Range   Ferritin 90 24 - 336 ng/mL    Comment: Performed at [facility], [place] [address] [place], [place] [address]  Triglycerides     Status: None   Collection Time: 12/14/18  9:43 AM  Result Value Ref Range   Triglycerides 123 <150 mg/dL    Comment: Performed at [facility], [place] [address] [place], [place] [address]  Fibrinogen     Status: None   Collection Time: 12/14/18  9:43 AM  Result Value Ref Range   Fibrinogen 454 210 - 475 mg/dL    Comment: Performed at [facility], [place] [address], [place], [place] [address]  C-reactive protein     Status: Abnormal   Collection Time:  12/14/18  9:43 AM  Result Value Ref Range   CRP 4.9 (H) <1.0 mg/dL    Comment: Performed at [facility], [place] [address], [place], [place] [address]  BNP     Status: None   Collection Time: 12/14/18  9:43 AM  Result Value Ref Range   B Natriuretic Peptide 38.5 0.0 - 100.0 pg/mL    Comment: Performed at [facility], [address], [place], [place] [address]   Dg Chest Port 1 View  Result Date: 12/14/2018 CLINICAL DATA:  Cough, shortness of breath EXAM: PORTABLE CHEST 1 VIEW COMPARISON:  03/25/2013 FINDINGS: Examination is somewhat limited secondary to poor penetration from patient body habitus. The heart size and mediastinal contours are within normal limits. Mild pulmonary vascular congestion. No focal airspace consolidation. No pleural effusion or pneumothorax. The visualized skeletal structures are unremarkable. IMPRESSION: Mild pulmonary vascular congestion. Electronically Signed   By: Duanne Guess M.D.   On: 12/14/2018 10:47    Pending Labs Unresulted Labs (From admission, onward)    Start     Ordered   12/14/18 1510  Influenza panel by PCR (type A & B)  (Influenza PCR Panel)  Once,   STAT     12/14/18 1509   12/14/18 0915  blood cx x2  BLOOD CULTURE X 2,   R (with STAT occurrences)     12/14/18 0914   Signed and Held  HIV Antibody (routine testing w rflx)  (HIV Antibody (Routine testing w reflex) panel)  Once,   R     Signed and Held          Vitals/Pain Today's Vitals   12/14/18 1100 12/14/18 1200 12/14/18 1315 12/14/18 1400  BP: 130/81 (!) 148/88 (!) 142/77 (!) 151/91  Pulse: 79 94 97 (!) 104  Resp: 20 (!) 21  (!) 25  Temp:      TempSrc:      SpO2: (!) 88% (!) 87% (!) 85% (!) 86%  Weight:      PainSc:        Isolation Precautions Droplet precaution  Medications Medications  0.9 %  sodium chloride infusion ( Intravenous New Bag/Given 12/14/18 0937)  methylPREDNISolone sodium succinate (SOLU-MEDROL) 125 mg/2 mL  injection 60 mg (has no administration in time range)  furosemide (LASIX) injection 20 mg (has no administration in time range)  ipratropium-albuterol (DUONEB) 0.5-2.5 (3) MG/3ML nebulizer solution 3 mL (has no administration in time range)  azithromycin (ZITHROMAX) tablet 500 mg (has no administration in time range)  hydrALAZINE (APRESOLINE) tablet 10 mg (has no administration in time range)  albuterol (VENTOLIN HFA) 108 (90 Base) MCG/ACT inhaler 4 puff (4 puffs Inhalation Given 12/14/18 0938)  methylPREDNISolone sodium succinate (SOLU-MEDROL) 125 mg/2 mL injection 125 mg (125 mg Intravenous Given 12/14/18 1311)  albuterol (VENTOLIN HFA) 108 (90 Base) MCG/ACT inhaler 4 puff (4 puffs Inhalation Given 12/14/18 1311)    Mobility walks Low fall risk   Focused Assessments respiratory    R Recommendations: See Admitting Provider Note  Report given to:   Additional Notes: N/A

## 2018-12-14 NOTE — ED Triage Notes (Signed)
Pt arrives POV with complaints of SHOB that began yesterday but has worsened today. Pt states he is unable to take a deep breath. Pt reports minimal chest pain. Pt denies COVID exposure, fevers,cough

## 2018-12-14 NOTE — ED Provider Notes (Signed)
[facility] DEPT Provider Note   CSN: [id number] Arrival date & time: 12/14/18  4696     History   Chief Complaint Chief Complaint  Patient presents with  . Shortness of Breath    HPI Raymond Chapman is a 33 y.o. male.     33 year old male with history of obesity presents with shortness of breath which began yesterday.  Patient is a smoker and states that he has had a cough which was productive in blood-tinged.  He is also had a sore throat which is since resolved.  Notes no change to his taste or smell.  Has had persistent chest tightness without leg pain or swelling.  No vomiting or diarrhea.  Denies any sick exposures.  States that his dyspnea has increased to the point where when he walks a few steps he gets really short of breath.  Denies any orthopnea.     Past Medical History:  Diagnosis Date  . Left ureteral stone     Patient Active Problem List   Diagnosis Date Noted  . Left ureteral stone 12/24/2014    Past Surgical History:  Procedure Laterality Date  . CYSTOSCOPY WITH RETROGRADE PYELOGRAM, URETEROSCOPY AND STENT PLACEMENT Left 01/19/2015   Procedure: CYSTOSCOPY WITH LEFT RETROGRADE PYELOGRAM, URETEROSCOPY, BASKETING OF STONES AND STENT EXCHANGE;  Surgeon: Alexis Frock, MD;  Location[facility];  Service: Urology;  Laterality: Left;  . CYSTOSCOPY WITH URETEROSCOPY AND STENT PLACEMENT Left 12/24/2014   Procedure: CYSTOSCOPY WITH URETEROSCOPY AND STENT PLACEMENT;  Surgeon: Festus Aloe, MD;  Location: [facility];  Service: Urology;  Laterality: Left;        Home Medications    Prior to Admission medications   Medication Sig Start Date End Date Taking? Authorizing Provider  ibuprofen (ADVIL,MOTRIN) 800 MG tablet Take 1 tablet (800 mg total) by mouth 3 (three) times daily. 12/28/15   Noemi Chapel, MD  ondansetron (ZOFRAN ODT) 4 MG disintegrating tablet Take 1 tablet (4 mg total) by mouth every 8 (eight) hours as  needed for nausea. 12/28/15   Noemi Chapel, MD  oxyCODONE-acetaminophen (PERCOCET) 5-325 MG tablet Take 1 tablet by mouth every 4 (four) hours as needed. 12/28/15   Noemi Chapel, MD  tamsulosin (FLOMAX) 0.4 MG CAPS capsule Take 1 capsule (0.4 mg total) by mouth 2 (two) times daily. 12/28/15   Noemi Chapel, MD    Family History No family history on file.  Social History Social History   Tobacco Use  . Smoking status: Current Every Day Smoker    Packs/day: 0.50    Types: Cigarettes  . Smokeless tobacco: Former Network engineer Use Topics  . Alcohol use: No  . Drug use: No     Allergies   Demerol [meperidine]   Review of Systems Review of Systems  All other systems reviewed and are negative.    Physical Exam Updated Vital Signs BP (!) 154/113 (BP Location: Left Arm)   Pulse (!) 108   Temp 99 F (37.2 C) (Oral)   Resp (!) 22   Wt (!) 154.8 kg   SpO2 95% Comment: Pt placed on 2L at this time for comfort  BMI 47.60 kg/m   Physical Exam Vitals signs and nursing note reviewed.  Constitutional:      General: He is not in acute distress.    Appearance: Normal appearance. He is well-developed. He is not toxic-appearing.  HENT:     Head: Normocephalic and atraumatic.  Eyes:     General: Lids  are normal.     Conjunctiva/sclera: Conjunctivae normal.     Pupils: Pupils are equal, round, and reactive to light.  Neck:     Musculoskeletal: Normal range of motion and neck supple.     Thyroid: No thyroid mass.     Trachea: No tracheal deviation.  Cardiovascular:     Rate and Rhythm: Regular rhythm. Tachycardia present.     Heart sounds: Normal heart sounds. No murmur. No gallop.   Pulmonary:     Effort: Tachypnea present. No respiratory distress.     Breath sounds: No stridor. Examination of the right-upper field reveals decreased breath sounds. Examination of the left-upper field reveals decreased breath sounds. Decreased breath sounds present. No wheezing, rhonchi or  rales.  Abdominal:     General: Bowel sounds are normal. There is no distension.     Palpations: Abdomen is soft.     Tenderness: There is no abdominal tenderness. There is no rebound.  Musculoskeletal: Normal range of motion.        General: No tenderness.  Skin:    General: Skin is warm and dry.     Findings: No abrasion or rash.  Neurological:     Mental Status: He is alert and oriented to person, place, and time.     GCS: GCS eye subscore is 4. GCS verbal subscore is 5. GCS motor subscore is 6.     Cranial Nerves: No cranial nerve deficit.     Sensory: No sensory deficit.  Psychiatric:        Speech: Speech normal.        Behavior: Behavior normal.      ED Treatments / Results  Labs (all labs ordered are listed, but only abnormal results are displayed) Labs Reviewed  CULTURE, BLOOD (ROUTINE X 2)  CULTURE, BLOOD (ROUTINE X 2)  SARS CORONAVIRUS 2 (TAT 6-24 HRS)  CBC WITH DIFFERENTIAL/PLATELET  COMPREHENSIVE METABOLIC PANEL  LACTIC ACID, PLASMA  D-DIMER, QUANTITATIVE (NOT AT [facility])  PROCALCITONIN  LACTATE DEHYDROGENASE  FERRITIN  TRIGLYCERIDES  FIBRINOGEN  C-REACTIVE PROTEIN    EKG EKG Interpretation  Date/Time:  Tuesday December 14 2018 09:06:08 EST Ventricular Rate:  110 PR Interval:    QRS Duration: 76 QT Interval:  335 QTC Calculation: 454 R Axis:   32 Text Interpretation: Sinus tachycardia Confirmed by Lorre Nick ([id number]) on 12/14/2018 10:56:50 AM   Radiology No results found.  Procedures Procedures (including critical care time)  Medications Ordered in ED Medications  0.9 %  sodium chloride infusion (has no administration in time range)  albuterol (VENTOLIN HFA) 108 (90 Base) MCG/ACT inhaler 4 puff (has no administration in time range)     Initial Impression / Assessment and Plan / ED Course  I have reviewed the triage vital signs and the nursing notes.  Pertinent labs & imaging results that were available during my care of the patient  were reviewed by me and considered in my medical decision making (see chart for details).       Patient had a chest x-ray today which showed vascular congestion but BNP was negative.  D-dimer negative as well 2.  Had wheezing on exam was treated with albuterol 4 puffs x 2 with temporary resolve.  Also given Solu-Medrol.  Pulse ox remains in the high 80s.  He is also symptomatic and will admit to the hospitalist service  CRITICAL CARE Performed by: Toy Baker Total critical care time: 60 minutes Critical care time was exclusive of separately billable procedures and  treating other patients. Critical care was necessary to treat or prevent imminent or life-threatening deterioration. Critical care was time spent personally by me on the following activities: development of treatment plan with patient and/or surrogate as well as nursing, discussions with consultants, evaluation of patient's response to treatment, examination of patient, obtaining history from patient or surrogate, ordering and performing treatments and interventions, ordering and review of laboratory studies, ordering and review of radiographic studies, pulse oximetry and re-evaluation of patient's condition.  Raymond RobertsonDaniel Chapman was evaluated in Emergency Department on 12/14/2018 for the symptoms described in the history of present illness. He was evaluated in the context of the global COVID-19 pandemic, which necessitated consideration that the patient might be at risk for infection with the SARS-CoV-2 virus that causes COVID-19. Institutional protocols and algorithms that pertain to the evaluation of patients at risk for COVID-19 are in a state of rapid change based on information released by regulatory bodies including the CDC and federal and state organizations. These policies and algorithms were followed during the patient's care in the ED.     Final Clinical Impressions(s) / ED Diagnoses   Final diagnoses:  None    ED  Discharge Orders    None       Lorre NickAllen, Amiere Cawley, MD 12/14/18 1358

## 2018-12-14 NOTE — H&P (Addendum)
History and Physical  Raymond Chapman WPY:[id number] DOB: [birth year] DOA: 12/14/2018  PCP: Patient, No Pcp Per Patient coming from: home   I have personally briefly reviewed patient's old medical records in [place]   Chief Complaint: SOB, Cough   HPI: Raymond Chapman is a 33 y.o. male past medical history significant for left ureteral stone, asthma as a child, current smoker who presents complaining of shortness of breath, productive cough, body aches for the last 2 days prior to admission.  He reports also sore throat.  He reports some chest and abdominal pain from frequent coughing.  He relates albuterol inhaler as helping today.  He denies sick contact.  Years ago he had a bad case of influenza.  He does not believe in vaccine, he has not gotten his flu vaccine.  Patient also reports low-grade fever at home.  Evaluation in the ED: Patient was found to be hypoxic 85 on room air, hypertensive, tachypneic respiration rate 25, tachycardic 108.  Sodium 133, potassium 4.1 BUN 11, creatinine 0.9 BNP 38, LDH 178, triglyceride 123, ferritin 90, CRP 4.9, procalcitonin less than 0.1 white blood cell 11, hemoglobin 16, D dimer negative.  Chest x-ray with mild pulmonary vascular congestion. He received IV Solu-Medrol 125 mg and albuterol in her treatment. Subsequently SARS coronavirus 2 came back negative.    Review of Systems: All systems reviewed and apart from history of presenting illness, are negative.  Past Medical History:  Diagnosis Date  . Left ureteral stone    Past Surgical History:  Procedure Laterality Date  . CYSTOSCOPY WITH RETROGRADE PYELOGRAM, URETEROSCOPY AND STENT PLACEMENT Left 01/19/2015   Procedure: CYSTOSCOPY WITH LEFT RETROGRADE PYELOGRAM, URETEROSCOPY, BASKETING OF STONES AND STENT EXCHANGE;  Surgeon: Alexis Frock, MD;  Location: [facility];  Service: Urology;  Laterality: Left;  . CYSTOSCOPY WITH URETEROSCOPY AND STENT PLACEMENT Left 12/24/2014    Procedure: CYSTOSCOPY WITH URETEROSCOPY AND STENT PLACEMENT;  Surgeon: Festus Aloe, MD;  Location: [place];  Service: Urology;  Laterality: Left;   Social History:  reports that he has been smoking cigarettes. He has been smoking about 0.50 packs per day. He has quit using smokeless tobacco. He reports that he does not drink alcohol or use drugs.  Allergies  Allergen Reactions  . Demerol [Meperidine] Other (See Comments)    hyperactivity   Family History; mother deceased 5 years ago had history of diabetes, COPD, A. fib.  Father has history of A. fib.   Prior to Admission medications   Medication Sig Start Date End Date Taking? Authorizing Provider  acetaminophen (TYLENOL) 325 MG tablet Take 650 mg by mouth every 6 (six) hours as needed for mild pain or headache.   Yes [provider]  ibuprofen (ADVIL,MOTRIN) 800 MG tablet Take 1 tablet (800 mg total) by mouth 3 (three) times daily. Patient not taking: Reported on 12/14/2018 12/28/15   Noemi Chapel, MD  ondansetron (ZOFRAN ODT) 4 MG disintegrating tablet Take 1 tablet (4 mg total) by mouth every 8 (eight) hours as needed for nausea. Patient not taking: Reported on 12/14/2018 12/28/15   Noemi Chapel, MD  oxyCODONE-acetaminophen (PERCOCET) 5-325 MG tablet Take 1 tablet by mouth every 4 (four) hours as needed. Patient not taking: Reported on 12/14/2018 12/28/15   Noemi Chapel, MD  tamsulosin (FLOMAX) 0.4 MG CAPS capsule Take 1 capsule (0.4 mg total) by mouth 2 (two) times daily. Patient not taking: Reported on 12/14/2018 12/28/15   Noemi Chapel, MD   Physical Exam:  Vitals:   12/14/18 1100 12/14/18 1200 12/14/18 1315 12/14/18 1400  BP: 130/81 (!) 148/88 (!) 142/77 (!) 151/91  Pulse: 79 94 97 (!) 104  Resp: 20 (!) 21  (!) 25  Temp:      TempSrc:      SpO2: (!) 88% (!) 87% (!) 85% (!) 86%  Weight:         General exam: Moderately built and nourished patient, lying comfortably supine on the gurney in no obvious  distress.  Head, eyes and ENT: Nontraumatic and normocephalic. Pupils equally reacting to light and accommodation. Oral mucosa moist.  Neck: Supple. No JVD, carotid bruit or thyromegaly.  Lymphatics: No lymphadenopathy.  Respiratory system: Tachypneic, decreased breath sounds, bilateral upper expiratory wheezing  Cardiovascular system: S1 and S2 heard, RRR. No JVD, murmurs, gallops, clicks or pedal edema.  Gastrointestinal system: Abdomen is nondistended, soft and nontender. Normal bowel sounds heard. No organomegaly or masses appreciated.  Central nervous system: Alert and oriented. No focal neurological deficits.  Extremities: Symmetric 5 x 5 power. Peripheral pulses symmetrically felt.   Skin: No rashes or acute findings.  Musculoskeletal system: Negative exam.  Psychiatry: Pleasant and cooperative.   Labs on Admission:  Basic Metabolic Panel: Recent Labs  Lab 12/14/18 0943  NA 133*  K 4.1  CL 97*  CO2 24  GLUCOSE 103*  BUN 11  CREATININE 0.92  CALCIUM 9.2   Liver Function Tests: Recent Labs  Lab 12/14/18 0943  AST 30  ALT 31  ALKPHOS 81  BILITOT 1.0  PROT 8.5*  ALBUMIN 4.5   No results for input(s): LIPASE, AMYLASE in the last 168 hours. No results for input(s): AMMONIA in the last 168 hours. CBC: Recent Labs  Lab 12/14/18 0943  WBC 11.9*  NEUTROABS 8.8*  HGB 16.4  HCT 50.2  MCV 87.2  PLT 265   Cardiac Enzymes: No results for input(s): CKTOTAL, CKMB, CKMBINDEX, TROPONINI in the last 168 hours.  BNP (last 3 results) No results for input(s): PROBNP in the last 8760 hours. CBG: No results for input(s): GLUCAP in the last 168 hours.  Radiological Exams on Admission: Dg Chest Port 1 View  Result Date: 12/14/2018 CLINICAL DATA:  Cough, shortness of breath EXAM: PORTABLE CHEST 1 VIEW COMPARISON:  03/25/2013 FINDINGS: Examination is somewhat limited secondary to poor penetration from patient body habitus. The heart size and mediastinal contours  are within normal limits. Mild pulmonary vascular congestion. No focal airspace consolidation. No pleural effusion or pneumothorax. The visualized skeletal structures are unremarkable. IMPRESSION: Mild pulmonary vascular congestion. Electronically Signed   By: Duanne GuessNicholas  Plundo M.D.   On: 12/14/2018 10:47    EKG: Independently reviewed.  Sinus tachycardia.  Assessment/Plan Active Problems:   Respiratory failure (HCC)   COPD exacerbation (HCC)   1-Acute hypoxic respiratory failure: New hypoxemia.  Present with oxygen saturation 85 on room air. -Could be related to COPD exacerbation in a current smoke but also chest x-ray with pulmonary vascular congestion, will rule out heart failure as well. -IV Solu-Medrol 60 mg every 6 hours. -Will give 2 doses of IV Lasix.  Reassess volume status.  follow renal function test. -Schedule nebulizer treatments, albuterol ipratropium. -Oral guaifenesin for cough. -Check 2D echo. -Check For influenza. -We will also start azithromycin.  2-obesity: Advised patient to lose weight.  3-current smoker: Counseling provided.  He is willing to stop.  He will need to be referred to PCP to start medication.  4-mild hyponatremia: Monitor 5-mild leukocytosis: Monitor 6-Elevated BP; no prior  history of HTN. PRN hydralazine. If BP continue to be elevated he will need oral schedule medication for HTN.  DVT Prophylaxis: Lovenox Code Status: Full code Family Communication: Care discussed with patient Disposition Plan: Admit to the hospital for treatment of acute hypoxic respiratory failure new onset.  Time spent: 75 minutes.   Alba Cory MD [facility]   12/14/2018, 3:26 PM

## 2018-12-15 ENCOUNTER — Inpatient Hospital Stay (HOSPITAL_COMMUNITY): Payer: Self-pay

## 2018-12-15 DIAGNOSIS — J441 Chronic obstructive pulmonary disease with (acute) exacerbation: Principal | ICD-10-CM

## 2018-12-15 DIAGNOSIS — R0602 Shortness of breath: Secondary | ICD-10-CM

## 2018-12-15 LAB — ECHOCARDIOGRAM COMPLETE
Height: 71 in
Weight: 5460.35 oz

## 2018-12-15 MED ORDER — METHYLPREDNISOLONE SODIUM SUCC 125 MG IJ SOLR
60.0000 mg | Freq: Two times a day (BID) | INTRAMUSCULAR | Status: DC
  Administered 2018-12-16: 60 mg via INTRAVENOUS
  Filled 2018-12-15: qty 2

## 2018-12-15 MED ORDER — AMLODIPINE BESYLATE 5 MG PO TABS
5.0000 mg | ORAL_TABLET | Freq: Every day | ORAL | Status: DC
  Administered 2018-12-15: 5 mg via ORAL
  Filled 2018-12-15: qty 1

## 2018-12-15 MED ORDER — AMLODIPINE BESYLATE 10 MG PO TABS
10.0000 mg | ORAL_TABLET | Freq: Every day | ORAL | Status: DC
  Administered 2018-12-16 – 2018-12-18 (×3): 10 mg via ORAL
  Filled 2018-12-15 (×3): qty 1

## 2018-12-15 NOTE — Progress Notes (Signed)
PROGRESS NOTE    Jeremie Giangrande  WUJ:[id number] DOB: [birth year] DOA: 12/14/2018 PCP: Patient, No Pcp Per    Brief Narrative:  Bond Grieshop is a 33 y.o. male past medical history significant for left ureteral stone, asthma as a child, current smoker who presented complaining of shortness of breath, productive cough, body aches for the last 2 days prior to admission.  He had some sore throat.  Nonspecific chest and abdominal pain from frequent coughing.  He denies sick contact.  Years ago he had a bad case of influenza.  He does not believe in vaccine, he has not gotten his flu vaccine.  Patient also reported low-grade fever at home.  Evaluation in the ED: Patient was found to be hypoxic 85 on room air, hypertensive, tachypneic respiration rate 25, tachycardic 108.  Sodium 133, potassium 4.1 BUN 11, creatinine 0.9 BNP 38, LDH 178, triglyceride 123, ferritin 90, CRP 4.9, procalcitonin less than 0.1 white blood cell 11, hemoglobin 16, D dimer negative.  Chest x-ray with mild pulmonary vascular congestion. He received IV Solu-Medrol 125 mg and albuterol. SARS coronavirus 2 and Influenza came back negative.   Assessment & Plan:   Active Problems:   Respiratory failure (HCC)   COPD exacerbation (HCC)  1-Acute hypoxic respiratory failure: New hypoxemia.  Presented with oxygen saturation 85 on room air. -Could be related to COPD exacerbation in a current smoker, chest x-ray with pulmonary vascular congestion, echo ordered to rule out heart failure as well. -He is on IV Solu-Medrol 60 mg every 6 hours.  He says his shortness of breath is improving.  We will try to wean Solu-Medrol as tolerated. -Given 2 doses of IV Lasix.  Symptoms improved at this time.  Awaiting echo results.  follow renal function test. -Continue scheduled nebulizer treatments, albuterol ipratropium. -Oral guaifenesin for cough. - Continue azithromycin for likely bronchitis.  2-obesity: Advised patient to lose weight.   3-current smoker: Counseling provided.  He is willing to stop.  He will need to be referred to PCP to be started on smoking cessation programs.  4-mild hyponatremia: Monitor 5-mild leukocytosis: Monitor 6-Elevated BP; no prior history of HTN.  However, patient does not have a primary care physician.  We will start him on Norvasc.  Continue PRN hydralazine.  Continue to monitor blood pressure and adjust medications accordingly.  DVT Prophylaxis: Lovenox Code Status: Full code Family Communication: Care discussed with patient Disposition Plan:  Discharge home once symptoms improved.   Antimicrobials:  Anti-infectives (From admission, onward)   Start     Dose/Rate Route Frequency Ordered Stop   12/14/18 1515  azithromycin (ZITHROMAX) tablet 500 mg     500 mg Oral Daily 12/14/18 1510          Subjective: He still having some shortness of breath with wheezing but considerably improved per patient.  Objective: Vitals:   12/15/18 0536 12/15/18 0600 12/15/18 0645 12/15/18 0836  BP: (!) 150/120  140/80   Pulse: 92     Resp: 18     Temp: (!) 97.4 F (36.3 C) 97.9 F (36.6 C)    TempSrc: Oral Oral    SpO2: 91%   95%  Weight:      Height:        Intake/Output Summary (Last 24 hours) at 12/15/2018 0849 Last data filed at 12/15/2018 0400 Gross per 24 hour  Intake 164.35 ml  Output -  Net 164.35 ml   Filed Weights   12/14/18 0913 12/14/18 1754  Weight: (!) 154.8  kg (!) 154.8 kg    Examination:  General exam: Obese male, not in any acute distress at this time. Respiratory system: Decreased with sounds lower lobes, bilateral wheezing, no rhonchi Cardiovascular system: S1 & S2 heard, RRR. No murmur. No pedal edema. Gastrointestinal system: Obese male, nondistended, soft and nontender. Normal bowel sounds heard. Central nervous system: Alert and oriented. No focal neurological deficits. Extremities: Symmetric 5 x 5 power. Skin: No rashes Psychiatry: Judgement and insight  appear normal. Mood & affect appropriate.     Data Reviewed: I have personally reviewed following labs and imaging studies  CBC: Recent Labs  Lab 12/14/18 0943  WBC 11.9*  NEUTROABS 8.8*  HGB 16.4  HCT 50.2  MCV 87.2  PLT 119   Basic Metabolic Panel: Recent Labs  Lab 12/14/18 0943  NA 133*  K 4.1  CL 97*  CO2 24  GLUCOSE 103*  BUN 11  CREATININE 0.92  CALCIUM 9.2   GFR: Estimated Creatinine Clearance: 173 mL/min (by C-G formula based on SCr of 0.92 mg/dL). Liver Function Tests: Recent Labs  Lab 12/14/18 0943  AST 30  ALT 31  ALKPHOS 81  BILITOT 1.0  PROT 8.5*  ALBUMIN 4.5   No results for input(s): LIPASE, AMYLASE in the last 168 hours. No results for input(s): AMMONIA in the last 168 hours. Coagulation Profile: No results for input(s): INR, PROTIME in the last 168 hours. Cardiac Enzymes: No results for input(s): CKTOTAL, CKMB, CKMBINDEX, TROPONINI in the last 168 hours. BNP (last 3 results) No results for input(s): PROBNP in the last 8760 hours. HbA1C: No results for input(s): HGBA1C in the last 72 hours. CBG: No results for input(s): GLUCAP in the last 168 hours. Lipid Profile: Recent Labs    12/14/18 0943  TRIG 123   Thyroid Function Tests: No results for input(s): TSH, T4TOTAL, FREET4, T3FREE, THYROIDAB in the last 72 hours. Anemia Panel: Recent Labs    12/14/18 0943  FERRITIN 90   Sepsis Labs: Recent Labs  Lab 12/14/18 0943  PROCALCITON <0.10  LATICACIDVEN 1.5    Recent Results (from the past 240 hour(s))  SARS CORONAVIRUS 2 (TAT 6-24 HRS) Nasopharyngeal Nasopharyngeal Swab     Status: None   Collection Time: 12/14/18  9:43 AM   Specimen: Nasopharyngeal Swab  Result Value Ref Range Status   SARS Coronavirus 2 NEGATIVE NEGATIVE Final    Comment: (NOTE) SARS-CoV-2 target nucleic acids are NOT DETECTED. The SARS-CoV-2 RNA is generally detectable in upper and lower respiratory specimens during the acute phase of infection.  Negative results do not preclude SARS-CoV-2 infection, do not rule out co-infections with other pathogens, and should not be used as the sole basis for treatment or other patient management decisions. Negative results must be combined with clinical observations, patient history, and epidemiological information. The expected result is Negative. Fact Sheet for Patients: SugarRoll.be Fact Sheet for Healthcare Providers: https://www.woods-mathews.com/ This test is not yet approved or cleared by the [place] FDA and  has been authorized for detection and/or diagnosis of SARS-CoV-2 by FDA under an Emergency Use Authorization (EUA). This EUA will remain  in effect (meaning this test can be used) for the duration of the COVID-19 declaration under Section 56 4(b)(1) of the Act, 21 U.S.C. section 360bbb-3(b)(1), unless the authorization is terminated or revoked sooner. Performed at [facility], [place] [address] [place], [place] [address]   blood cx x2     Status: None (Preliminary result)   Collection Time: 12/14/18  9:45  AM   Specimen: BLOOD  Result Value Ref Range Status   Specimen Description   Final    BLOOD LEFT ANTECUBITAL Performed at [facility], [address] [place], [place] [address]    Special Requests   Final    BOTTLES DRAWN AEROBIC AND ANAEROBIC Blood Culture results may not be optimal due to an excessive volume of blood received in culture bottles Performed at [facility], [address] [place], [place] [address]    Culture   Final    NO GROWTH < 24 HOURS Performed at [facility], [address] [place], [place] [address]    Report Status PENDING  Incomplete  blood cx x2     Status: None (Preliminary result)   Collection Time: 12/14/18 10:05 AM   Specimen: BLOOD RIGHT HAND  Result Value Ref Range Status   Specimen Description   Final    BLOOD RIGHT HAND Performed  at [facility], [address] [place], [place] [address]    Special Requests   Final    BOTTLES DRAWN AEROBIC AND ANAEROBIC Blood Culture results may not be optimal due to an excessive volume of blood received in culture bottles Performed at [facility], [address] [place], [place] [address]    Culture   Final    NO GROWTH < 24 HOURS Performed at [facility], [address] [place], [place] [address]    Report Status PENDING  Incomplete         Radiology Studies: Dg Chest Port 1 View  Result Date: 12/14/2018 CLINICAL DATA:  Cough, shortness of breath EXAM: PORTABLE CHEST 1 VIEW COMPARISON:  03/25/2013 FINDINGS: Examination is somewhat limited secondary to poor penetration from patient body habitus. The heart size and mediastinal contours are within normal limits. Mild pulmonary vascular congestion. No focal airspace consolidation. No pleural effusion or pneumothorax. The visualized skeletal structures are unremarkable. IMPRESSION: Mild pulmonary vascular congestion. Electronically Signed   By: Duanne GuessNicholas  Plundo M.D.   On: 12/14/2018 10:47        Scheduled Meds: . azithromycin  500 mg Oral Daily  . enoxaparin (LOVENOX) injection  40 mg Subcutaneous Q24H  . furosemide  20 mg Intravenous BID  . guaiFENesin  600 mg Oral BID  . ipratropium-albuterol  3 mL Nebulization TID  . methylPREDNISolone (SOLU-MEDROL) injection  60 mg Intravenous Q6H  . sodium chloride flush  3 mL Intravenous Q12H   Continuous Infusions: . sodium chloride Stopped (12/14/18 1900)     LOS: 1 day    Vonzella NippleAnupama Romi Rathel, MD [facility] Pager on amion  If 7PM-7AM, please contact night-coverage [email or web address] Password [email or web address] 12/15/2018, 8:49 AM

## 2018-12-15 NOTE — Progress Notes (Signed)
BP= 150/120. Hydralazine PRN was given as MD ordered. Will recheck BP later. Alert and oriented x 4.

## 2018-12-15 NOTE — Progress Notes (Signed)
  Echocardiogram 2D Echocardiogram has been performed.  Raymond Chapman 12/15/2018, 2:33 PM

## 2018-12-16 LAB — CBC
HCT: 51.5 % (ref 39.0–52.0)
Hemoglobin: 16.4 g/dL (ref 13.0–17.0)
MCH: 28.3 pg (ref 26.0–34.0)
MCHC: 31.8 g/dL (ref 30.0–36.0)
MCV: 88.9 fL (ref 80.0–100.0)
Platelets: 298 10*3/uL (ref 150–400)
RBC: 5.79 MIL/uL (ref 4.22–5.81)
RDW: 13.3 % (ref 11.5–15.5)
WBC: 25.7 10*3/uL — ABNORMAL HIGH (ref 4.0–10.5)
nRBC: 0 % (ref 0.0–0.2)

## 2018-12-16 LAB — BASIC METABOLIC PANEL
Anion gap: 11 (ref 5–15)
BUN: 19 mg/dL (ref 6–20)
CO2: 27 mmol/L (ref 22–32)
Calcium: 8.8 mg/dL — ABNORMAL LOW (ref 8.9–10.3)
Chloride: 98 mmol/L (ref 98–111)
Creatinine, Ser: 0.93 mg/dL (ref 0.61–1.24)
GFR calc Af Amer: >60 mL/min (ref >60)
GFR calc non Af Amer: >60 mL/min (ref >60)
Glucose, Bld: 128 mg/dL — ABNORMAL HIGH (ref 70–99)
Potassium: 4.2 mmol/L (ref 3.5–5.1)
Sodium: 136 mmol/L (ref 135–145)

## 2018-12-16 LAB — MAGNESIUM: Magnesium: 2.2 mg/dL (ref 1.7–2.4)

## 2018-12-16 MED ORDER — METHYLPREDNISOLONE SODIUM SUCC 125 MG IJ SOLR
60.0000 mg | Freq: Three times a day (TID) | INTRAMUSCULAR | Status: DC
  Administered 2018-12-16 – 2018-12-17 (×3): 60 mg via INTRAVENOUS
  Filled 2018-12-16 (×3): qty 2

## 2018-12-16 MED ORDER — ATENOLOL 25 MG PO TABS
25.0000 mg | ORAL_TABLET | Freq: Every day | ORAL | Status: DC
  Administered 2018-12-16 – 2018-12-18 (×3): 25 mg via ORAL
  Filled 2018-12-16 (×3): qty 1

## 2018-12-16 MED ORDER — ENOXAPARIN SODIUM 80 MG/0.8ML ~~LOC~~ SOLN
80.0000 mg | SUBCUTANEOUS | Status: DC
  Administered 2018-12-16 – 2018-12-17 (×2): 80 mg via SUBCUTANEOUS
  Filled 2018-12-16 (×2): qty 0.8

## 2018-12-16 NOTE — Progress Notes (Signed)
PROGRESS NOTE    Raymond Chapman  JKK:[id number] DOB: 09/04/1985 DOA: 12/14/2018 PCP: Patient, No Pcp Per   Brief Narrative:  Raymond Chapman a 33 y.o.malepast medical history significant for left ureteral stone, asthma as a child, current smoker who presented complaining of shortness of breath, productive cough, body aches for the last 2 days prior to admission. He had some sore throat. Nonspecific chest and abdominal pain from frequent coughing. He denies sick contact. Years ago he had a bad case of influenza. He does not believe in vaccine, he has not gotten his flu vaccine.Patient also reported low-grade fever at home.  Evaluation in the ED: Patient was found to be hypoxic 85 on room air, hypertensive, tachypneic respiration rate 25, tachycardic 108.Sodium 133, potassium 4.1 BUN 11, creatinine 0.9 BNP 38, LDH 178, triglyceride 123, ferritin 90, CRP 4.9, procalcitonin less than 0.1 white blood cell 11, hemoglobin 16, D dimer negative.Chest x-ray with mild pulmonary vascular congestion. He received IV Solu-Medrol 125 mg and albuterol. SARS coronavirus 2 and Influenza came back negative.   Assessment & Plan:   Active Problems:   Respiratory failure (HCC)   COPD exacerbation (HCC)  1-Acute hypoxic respiratory failure: New hypoxemia.Presented with oxygen saturation 85 on room air. -Could be related to COPD exacerbation in a current smoker, chest x-ray with pulmonary vascular congestion, echo showing EF 55 to 71%, grade 1 diastolic dysfunction -He was on IV Solu-Medrol 60 mg every 6 hours.  Given 2 doses of IV Lasix.   12/16/2018: I had decreased the dose of Solu-Medrol to every 12 hours in anticipation of discharge.  However, this morning he says his wheezing has worsened.  He is having cough, becoming more productive which he says is helping. -We will increase the dose of Solu-Medrol to every 8 hours due to worsening wheezing this morning. -Continue scheduled nebulizer  treatments,albuterol ipratropium. -Oral guaifenesin for cough. - Continue azithromycin for likely bronchitis.  2-obesity: Advised patient to lose weight.  3-current smoker: Counseling provided. He is willing to stop. He will need to be referred to PCP to be started on smoking cessation programs. 12/16/2018: Discussed with the patient about setting him up with a primary care physician.  He says he has a physician in the community but has not seen the physician for several years.  At this time he is still thinking whether he should follow-up with the same physician that has known him from before.  4-mild hyponatremia: Monitor 5-leukocytosis: 12/16/2018: Initial WBC count was high but later trended down.  Now increasing again likely secondary to the high-dose Solu-Medrol.  Afebrile at this time.  6-Elevated BP;no prior history of HTN.  However, patient has not seen primary care physician in several years.  12/16/2018: Started on 10 mg Norvasc yesterday.  However, blood pressure continues to be high.  We will start him on atenolol. Continue PRN hydralazine.  Continue to monitor blood pressure and adjust medications accordingly.  He is advised to follow-up with outpatient physician.  DVT Prophylaxis:Lovenox Code Status:Full code Family Communication:Care discussed with patient Disposition Plan: Discharge home once symptoms improved.   Antimicrobials:  Anti-infectives (From admission, onward)   Start     Dose/Rate Route Frequency Ordered Stop   12/14/18 1515  azithromycin (ZITHROMAX) tablet 500 mg     500 mg Oral Daily 12/14/18 1510         Subjective: He is complaining of increasing wheezing today after the Solu-Medrol dose was decreased.  Having cough which he says is becoming more productive.  Objective: Vitals:   12/15/18 1952 12/15/18 2115 12/16/18 0513 12/16/18 0726  BP:  (!) 155/91 (!) 160/92   Pulse:  87 88   Resp:  16 16   Temp:  97.8 F (36.6 C) 97.9 F  (36.6 C)   TempSrc:  Oral Oral   SpO2: 94% 94% 93% 96%  Weight:      Height:        Intake/Output Summary (Last 24 hours) at 12/16/2018 1146 Last data filed at 12/16/2018 0943 Gross per 24 hour  Intake 3 ml  Output -  Net 3 ml   Filed Weights   12/14/18 0913 12/14/18 1754  Weight: (!) 154.8 kg (!) 154.8 kg    Examination:  General exam: Obese male, not in any acute distress at this time.  Respiratory system: Improving air entry.  However, increased wheezing.  No rhonchi. Cardiovascular system: S1 & S2.  No murmur. Gastrointestinal system: Obese male, soft and nontender. Normal bowel sounds heard. Central nervous system: Alert and oriented. No focal neurological deficits. Extremities: Symmetric 5 x 5 power. Skin: No rashes Psychiatry: Judgement and insight appear normal. Mood & affect appropriate.     Data Reviewed: I have personally reviewed following labs and imaging studies  CBC: Recent Labs  Lab 12/14/18 0943 12/16/18 0509  WBC 11.9* 25.7*  NEUTROABS 8.8*  --   HGB 16.4 16.4  HCT 50.2 51.5  MCV 87.2 88.9  PLT 265 298   Basic Metabolic Panel: Recent Labs  Lab 12/14/18 0943 12/16/18 0509  NA 133* 136  K 4.1 4.2  CL 97* 98  CO2 24 27  GLUCOSE 103* 128*  BUN 11 19  CREATININE 0.92 0.93  CALCIUM 9.2 8.8*  MG  --  2.2   GFR: Estimated Creatinine Clearance: 171.1 mL/min (by C-G formula based on SCr of 0.93 mg/dL). Liver Function Tests: Recent Labs  Lab 12/14/18 0943  AST 30  ALT 31  ALKPHOS 81  BILITOT 1.0  PROT 8.5*  ALBUMIN 4.5   No results for input(s): LIPASE, AMYLASE in the last 168 hours. No results for input(s): AMMONIA in the last 168 hours. Coagulation Profile: No results for input(s): INR, PROTIME in the last 168 hours. Cardiac Enzymes: No results for input(s): CKTOTAL, CKMB, CKMBINDEX, TROPONINI in the last 168 hours. BNP (last 3 results) No results for input(s): PROBNP in the last 8760 hours. HbA1C: No results for input(s):  HGBA1C in the last 72 hours. CBG: No results for input(s): GLUCAP in the last 168 hours. Lipid Profile: Recent Labs    12/14/18 0943  TRIG 123   Thyroid Function Tests: No results for input(s): TSH, T4TOTAL, FREET4, T3FREE, THYROIDAB in the last 72 hours. Anemia Panel: Recent Labs    12/14/18 0943  FERRITIN 90   Sepsis Labs: Recent Labs  Lab 12/14/18 0943  PROCALCITON <0.10  LATICACIDVEN 1.5    Recent Results (from the past 240 hour(s))  SARS CORONAVIRUS 2 (TAT 6-24 HRS) Nasopharyngeal Nasopharyngeal Swab     Status: None   Collection Time: 12/14/18  9:43 AM   Specimen: Nasopharyngeal Swab  Result Value Ref Range Status   SARS Coronavirus 2 NEGATIVE NEGATIVE Final    Comment: (NOTE) SARS-CoV-2 target nucleic acids are NOT DETECTED. The SARS-CoV-2 RNA is generally detectable in upper and lower respiratory specimens during the acute phase of infection. Negative results do not preclude SARS-CoV-2 infection, do not rule out co-infections with other pathogens, and should not be used as the sole basis for treatment or  other patient management decisions. Negative results must be combined with clinical observations, patient history, and epidemiological information. The expected result is Negative. Fact Sheet for Patients: HairSlick.nohttps://www.fda.gov/media/138098/download Fact Sheet for Healthcare Providers: quierodirigir.comhttps://www.fda.gov/media/138095/download This test is not yet approved or cleared by the [place] FDA and  has been authorized for detection and/or diagnosis of SARS-CoV-2 by FDA under an Emergency Use Authorization (EUA). This EUA will remain  in effect (meaning this test can be used) for the duration of the COVID-19 declaration under Section 56 4(b)(1) of the Act, 21 U.S.C. section 360bbb-3(b)(1), unless the authorization is terminated or revoked sooner. Performed at [facility], [address] [place], [place] [address]   blood cx x2     Status: None  (Preliminary result)   Collection Time: 12/14/18  9:45 AM   Specimen: BLOOD  Result Value Ref Range Status   Specimen Description   Final    BLOOD LEFT ANTECUBITAL Performed at [facility], [address] [place], [place] [address]    Special Requests   Final    BOTTLES DRAWN AEROBIC AND ANAEROBIC Blood Culture results may not be optimal due to an excessive volume of blood received in culture bottles Performed at [facility], [address] [place], [place] [address]    Culture   Final    NO GROWTH 2 DAYS Performed at [facility], [address] [place], [place] [address]    Report Status PENDING  Incomplete  blood cx x2     Status: None (Preliminary result)   Collection Time: 12/14/18 10:05 AM   Specimen: BLOOD RIGHT HAND  Result Value Ref Range Status   Specimen Description   Final    BLOOD RIGHT HAND Performed at [facility], [address] [place], [place] [address]    Special Requests   Final    BOTTLES DRAWN AEROBIC AND ANAEROBIC Blood Culture results may not be optimal due to an excessive volume of blood received in culture bottles Performed at [facility], [address] [place], [place] [address]    Culture   Final    NO GROWTH 2 DAYS Performed at [facility], [address] [place], [place] [address]    Report Status PENDING  Incomplete         Radiology Studies: No results found.      Scheduled Meds: . amLODipine  10 mg Oral Daily  . atenolol  25 mg Oral Daily  . azithromycin  500 mg Oral Daily  . enoxaparin (LOVENOX) injection  40 mg Subcutaneous Q24H  . guaiFENesin  600 mg Oral BID  . ipratropium-albuterol  3 mL Nebulization TID  . methylPREDNISolone (SOLU-MEDROL) injection  60 mg Intravenous Q12H  . sodium chloride flush  3 mL Intravenous Q12H   Continuous Infusions: . sodium chloride Stopped (12/14/18 1900)     LOS: 2 days    Vonzella NippleAnupama  Sahara Fujimoto, MD [facility] Pager on amion  If 7PM-7AM, please contact night-coverage [email or web address] Password [email or web address] 12/16/2018, 11:46 AM

## 2018-12-17 LAB — CBC WITH DIFFERENTIAL/PLATELET
Abs Immature Granulocytes: 0.38 10*3/uL — ABNORMAL HIGH (ref 0.00–0.07)
Basophils Absolute: 0.1 10*3/uL (ref 0.0–0.1)
Basophils Relative: 0 %
Eosinophils Absolute: 0 10*3/uL (ref 0.0–0.5)
Eosinophils Relative: 0 %
HCT: 51 % (ref 39.0–52.0)
Hemoglobin: 16.4 g/dL (ref 13.0–17.0)
Immature Granulocytes: 1 %
Lymphocytes Relative: 12 %
Lymphs Abs: 3.5 10*3/uL (ref 0.7–4.0)
MCH: 28.5 pg (ref 26.0–34.0)
MCHC: 32.2 g/dL (ref 30.0–36.0)
MCV: 88.5 fL (ref 80.0–100.0)
Monocytes Absolute: 1 10*3/uL (ref 0.1–1.0)
Monocytes Relative: 4 %
Neutro Abs: 23.5 10*3/uL — ABNORMAL HIGH (ref 1.7–7.7)
Neutrophils Relative %: 83 %
Platelets: 322 10*3/uL (ref 150–400)
RBC: 5.76 MIL/uL (ref 4.22–5.81)
RDW: 13.2 % (ref 11.5–15.5)
WBC: 28.5 10*3/uL — ABNORMAL HIGH (ref 4.0–10.5)
nRBC: 0 % (ref 0.0–0.2)

## 2018-12-17 LAB — BASIC METABOLIC PANEL
Anion gap: 10 (ref 5–15)
BUN: 20 mg/dL (ref 6–20)
CO2: 26 mmol/L (ref 22–32)
Calcium: 8.8 mg/dL — ABNORMAL LOW (ref 8.9–10.3)
Chloride: 99 mmol/L (ref 98–111)
Creatinine, Ser: 0.86 mg/dL (ref 0.61–1.24)
GFR calc Af Amer: >60 mL/min (ref >60)
GFR calc non Af Amer: >60 mL/min (ref >60)
Glucose, Bld: 148 mg/dL — ABNORMAL HIGH (ref 70–99)
Potassium: 4.4 mmol/L (ref 3.5–5.1)
Sodium: 135 mmol/L (ref 135–145)

## 2018-12-17 MED ORDER — METHYLPREDNISOLONE SODIUM SUCC 125 MG IJ SOLR
60.0000 mg | Freq: Two times a day (BID) | INTRAMUSCULAR | Status: DC
  Administered 2018-12-17 – 2018-12-18 (×2): 60 mg via INTRAVENOUS
  Filled 2018-12-17 (×2): qty 2

## 2018-12-17 MED ORDER — TRAZODONE HCL 50 MG PO TABS
50.0000 mg | ORAL_TABLET | Freq: Once | ORAL | Status: AC
  Administered 2018-12-17: 50 mg via ORAL
  Filled 2018-12-17: qty 1

## 2018-12-17 NOTE — Progress Notes (Signed)
PROGRESS NOTE    Raymond Chapman  YQM:[id number] DOB: [birth year] DOA: 12/14/2018 PCP: Patient, No Pcp Per    Brief Narrative:  Raymond Chapman a 33 y.o.malepast medical history significant for left ureteral stone, asthma as a child, current smoker who presentedcomplaining of shortness of breath, productive cough, body aches for the last 2 days prior to admission. Hehad somesore throat. Nonspecificchest and abdominal pain from frequent coughing. He denies sick contact. Years ago he had a bad case of influenza. He does not believe in vaccine, he has not gotten his flu vaccine.Patient also reportedlow-grade fever at home. In ED: Patient was found to be hypoxic 85 on room air, hypertensive, tachypneic respiration rate 25, tachycardic 108.Sodium 133, potassium 4.1 BUN 11, creatinine 0.9 BNP 38, LDH 178, triglyceride 123, ferritin 90, CRP 4.9, procalcitonin less than 0.1 white blood cell 11, hemoglobin 16, D dimer negative.Chest x-ray with mild pulmonary vascular congestion. He received IV Solu-Medrol 125 mg and albuterol. SARS coronavirus 2and Influenzacame back negative.  Assessment & Plan:   Active Problems:   Respiratory failure (HCC)   COPD exacerbation (HCC)  1-Acute hypoxic respiratory failure: New hypoxemia.Presentedwith oxygen saturation 85 on room air. -Could be related to COPD exacerbation in a current smoker,chest x-ray with pulmonary vascular congestion,echo showing EF 55 to 60%, grade 1 diastolic dysfunction. -He was onIV Solu-Medrol 60 mg every 6 hours.Given2 doses of IV Lasix.  12/16/2018: Dose of Solu-Medrol decreased to every 12 hours in anticipation of discharge.  However, this has caused worsening of his wheezing.  - dose of Solu-Medrol changed to every 8 hours due to worsening wheezing , - Continue schedulednebulizer treatments,albuterol ipratropium. - Oral guaifenesin for cough. - Continueazithromycinfor likely bronchitis.  2-obesity:  Advised patient to lose weight.  3-current smoker: Counseling provided. He is willing to stop. He will need to be referred to PCPto be started on smoking cessation programs. 12/17/2018: Discussed with the patient about setting him up with a primary care physician.  He says he has a physician in the community but has not seen the physician for several years.    4-mild hyponatremia: Resolved.   5-leukocytosis: 12/16/2018: Initial WBC count was high but later trended down.  Now increasing again likely secondary to the high-dose Solu-Medrol.  Afebrile at this time.  6-Elevated BP;no prior history of HTN.However, patient has not seen primary care physician in several years.  12/16/2018: Started on 10 mg Norvasc yesterday.  However, blood pressure continues to be high.  started on atenolol.Continue PRN hydralazine.Continue to monitor blood pressure and adjust medications accordingly.  He is advised to follow-up with outpatient physician.  DVT Prophylaxis:Lovenox Code Status:Full code Family Communication:Care discussed with patient Disposition Plan:Discharge home once symptoms improved.    Procedures: None.  Antimicrobials: Anti-infectives (From admission, onward)   Start     Dose/Rate Route Frequency Ordered Stop   12/14/18 1515  azithromycin (ZITHROMAX) tablet 500 mg     500 mg Oral Daily 12/14/18 1510        Subjective: Patient is feeling better but still has significant wheezing on examination.  He report worsening shortness of breath on ambulation.  Objective: Vitals:   12/16/18 2023 12/17/18 0454 12/17/18 0739 12/17/18 1447  BP: (!) 156/73 (!) 105/52  (!) 148/69  Pulse: 61 (!) 56  60  Resp: 17 17  18   Temp: 97.9 F (36.6 C) 98.1 F (36.7 C)  97.7 F (36.5 C)  TempSrc: Oral   Oral  SpO2: 94% 94% 98% 94%  Weight:  Height:        Intake/Output Summary (Last 24 hours) at 12/17/2018 1633 Last data filed at 12/17/2018 0606 Gross per 24 hour   Intake 360 ml  Output 0 ml  Net 360 ml   Filed Weights   12/14/18 0913 12/14/18 1754  Weight: (!) 154.8 kg (!) 154.8 kg    Examination:  General exam: Appears calm and comfortable  Respiratory system: Significant wheezing on auscultation. Respiratory effort normal. Cardiovascular system: S1 & S2 heard, RRR. No JVD, murmurs, rubs, gallops or clicks. No pedal edema. Gastrointestinal system: Abdomen is nondistended, soft and nontender. No organomegaly or masses felt. Normal bowel sounds heard. Central nervous system: Alert and oriented. No focal neurological deficits. Extremities: Symmetric 5 x 5 power. Skin: No rashes, lesions or ulcers Psychiatry: Judgement and insight appear normal. Mood & affect appropriate.     Data Reviewed: I have personally reviewed following labs and imaging studies  CBC: Recent Labs  Lab 12/14/18 0943 12/16/18 0509 12/17/18 0553  WBC 11.9* 25.7* 28.5*  NEUTROABS 8.8*  --  23.5*  HGB 16.4 16.4 16.4  HCT 50.2 51.5 51.0  MCV 87.2 88.9 88.5  PLT 265 298 417   Basic Metabolic Panel: Recent Labs  Lab 12/14/18 0943 12/16/18 0509 12/17/18 0553  NA 133* 136 135  K 4.1 4.2 4.4  CL 97* 98 99  CO2 24 27 26   GLUCOSE 103* 128* 148*  BUN 11 19 20   CREATININE 0.92 0.93 0.86  CALCIUM 9.2 8.8* 8.8*  MG  --  2.2  --    GFR: Estimated Creatinine Clearance: 185.1 mL/min (by C-G formula based on SCr of 0.86 mg/dL). Liver Function Tests: Recent Labs  Lab 12/14/18 0943  AST 30  ALT 31  ALKPHOS 81  BILITOT 1.0  PROT 8.5*  ALBUMIN 4.5   No results for input(s): LIPASE, AMYLASE in the last 168 hours. No results for input(s): AMMONIA in the last 168 hours. Coagulation Profile: No results for input(s): INR, PROTIME in the last 168 hours. Cardiac Enzymes: No results for input(s): CKTOTAL, CKMB, CKMBINDEX, TROPONINI in the last 168 hours. BNP (last 3 results) No results for input(s): PROBNP in the last 8760 hours. HbA1C: No results for input(s):  HGBA1C in the last 72 hours. CBG: No results for input(s): GLUCAP in the last 168 hours. Lipid Profile: No results for input(s): CHOL, HDL, LDLCALC, TRIG, CHOLHDL, LDLDIRECT in the last 72 hours. Thyroid Function Tests: No results for input(s): TSH, T4TOTAL, FREET4, T3FREE, THYROIDAB in the last 72 hours. Anemia Panel: No results for input(s): VITAMINB12, FOLATE, FERRITIN, TIBC, IRON, RETICCTPCT in the last 72 hours. Sepsis Labs: Recent Labs  Lab 12/14/18 0943  PROCALCITON <0.10  LATICACIDVEN 1.5    Recent Results (from the past 240 hour(s))  SARS CORONAVIRUS 2 (TAT 6-24 HRS) Nasopharyngeal Nasopharyngeal Swab     Status: None   Collection Time: 12/14/18  9:43 AM   Specimen: Nasopharyngeal Swab  Result Value Ref Range Status   SARS Coronavirus 2 NEGATIVE NEGATIVE Final    Comment: (NOTE) SARS-CoV-2 target nucleic acids are NOT DETECTED. The SARS-CoV-2 RNA is generally detectable in upper and lower respiratory specimens during the acute phase of infection. Negative results do not preclude SARS-CoV-2 infection, do not rule out co-infections with other pathogens, and should not be used as the sole basis for treatment or other patient management decisions. Negative results must be combined with clinical observations, patient history, and epidemiological information. The expected result is Negative. Fact Sheet for Patients:  HairSlick.no Fact Sheet for Healthcare Providers: quierodirigir.com This test is not yet approved or cleared by the [place] FDA and  has been authorized for detection and/or diagnosis of SARS-CoV-2 by FDA under an Emergency Use Authorization (EUA). This EUA will remain  in effect (meaning this test can be used) for the duration of the COVID-19 declaration under Section 56 4(b)(1) of the Act, 21 U.S.C. section 360bbb-3(b)(1), unless the authorization is terminated or revoked sooner. Performed at  [facility], [address] [place], [place] [address]   blood cx x2     Status: None (Preliminary result)   Collection Time: 12/14/18  9:45 AM   Specimen: BLOOD  Result Value Ref Range Status   Specimen Description   Final    BLOOD LEFT ANTECUBITAL Performed at [facility], [address] [place], [place] [address]    Special Requests   Final    BOTTLES DRAWN AEROBIC AND ANAEROBIC Blood Culture results may not be optimal due to an excessive volume of blood received in culture bottles Performed at [facility], [address] [place], [place] [address]    Culture   Final    NO GROWTH 3 DAYS Performed at [facility], [address] [place], [place] [address]    Report Status PENDING  Incomplete  blood cx x2     Status: None (Preliminary result)   Collection Time: 12/14/18 10:05 AM   Specimen: BLOOD RIGHT HAND  Result Value Ref Range Status   Specimen Description   Final    BLOOD RIGHT HAND Performed at [facility], [address] [place], [place] [address]    Special Requests   Final    BOTTLES DRAWN AEROBIC AND ANAEROBIC Blood Culture results may not be optimal due to an excessive volume of blood received in culture bottles Performed at [facility], [address], [place], [place] [address]    Culture   Final    NO GROWTH 3 DAYS Performed at [facility], [address] [place], [place] [address]    Report Status PENDING  Incomplete     Radiology Studies: No results found.   Scheduled Meds: . amLODipine  10 mg Oral Daily  . atenolol  25 mg Oral Daily  . azithromycin  500 mg Oral Daily  . enoxaparin (LOVENOX) injection  80 mg Subcutaneous Q24H  . guaiFENesin  600 mg Oral BID  . ipratropium-albuterol  3 mL Nebulization TID  . methylPREDNISolone (SOLU-MEDROL) injection  60 mg Intravenous Q12H  . sodium chloride flush  3 mL Intravenous Q12H   Continuous Infusions:  . sodium chloride Stopped (12/14/18 1900)     LOS: 3 days    Time spent:     Cipriano Bunker, MD [facility]   If 7PM-7AM, please contact night-coverage

## 2018-12-18 LAB — COMPREHENSIVE METABOLIC PANEL
ALT: 45 U/L — ABNORMAL HIGH (ref 0–44)
AST: 26 U/L (ref 15–41)
Albumin: 3.7 g/dL (ref 3.5–5.0)
Alkaline Phosphatase: 61 U/L (ref 38–126)
Anion gap: 11 (ref 5–15)
BUN: 20 mg/dL (ref 6–20)
CO2: 25 mmol/L (ref 22–32)
Calcium: 9 mg/dL (ref 8.9–10.3)
Chloride: 100 mmol/L (ref 98–111)
Creatinine, Ser: 0.86 mg/dL (ref 0.61–1.24)
GFR calc Af Amer: >60 mL/min (ref >60)
GFR calc non Af Amer: >60 mL/min (ref >60)
Glucose, Bld: 131 mg/dL — ABNORMAL HIGH (ref 70–99)
Potassium: 4.3 mmol/L (ref 3.5–5.1)
Sodium: 136 mmol/L (ref 135–145)
Total Bilirubin: 0.5 mg/dL (ref 0.3–1.2)
Total Protein: 7.7 g/dL (ref 6.5–8.1)

## 2018-12-18 LAB — CBC
HCT: 53.9 % — ABNORMAL HIGH (ref 39.0–52.0)
Hemoglobin: 17.1 g/dL — ABNORMAL HIGH (ref 13.0–17.0)
MCH: 28 pg (ref 26.0–34.0)
MCHC: 31.7 g/dL (ref 30.0–36.0)
MCV: 88.4 fL (ref 80.0–100.0)
Platelets: 345 10*3/uL (ref 150–400)
RBC: 6.1 MIL/uL — ABNORMAL HIGH (ref 4.22–5.81)
RDW: 13 % (ref 11.5–15.5)
WBC: 27.8 10*3/uL — ABNORMAL HIGH (ref 4.0–10.5)
nRBC: 0 % (ref 0.0–0.2)

## 2018-12-18 LAB — MAGNESIUM: Magnesium: 2.2 mg/dL (ref 1.7–2.4)

## 2018-12-18 LAB — PHOSPHORUS: Phosphorus: 4.2 mg/dL (ref 2.5–4.6)

## 2018-12-18 MED ORDER — METHYLPREDNISOLONE 4 MG PO TBPK: ORAL_TABLET | ORAL | 0 refills | Status: DC

## 2018-12-18 MED ORDER — AMLODIPINE BESYLATE 10 MG PO TABS: 10.0000 mg | ORAL_TABLET | Freq: Every day | ORAL | 0 refills | Status: DC

## 2018-12-18 MED ORDER — GUAIFENESIN ER 600 MG PO TB12: 600.0000 mg | ORAL_TABLET | Freq: Two times a day (BID) | ORAL | 0 refills | Status: DC

## 2018-12-18 NOTE — Progress Notes (Signed)
Notified by tele, patient HR 35, patient asymptomatic and sleeping. Vitals taken. Patient HR now 62 on monitor. On call notified. No new orders. Will continue to monitor.

## 2018-12-18 NOTE — Discharge Summary (Signed)
Physician Discharge Summary  Raymond Chapman BJY:[id number] DOB: [birth year] DOA: 12/14/2018  PCP: Patient, No Pcp Per  Admit date: 12/14/2018   Discharge date: 12/18/2018  Admitted From:  Home  Disposition:  Home  Recommendations for Outpatient Follow-up:  1. Follow up with PCP in 1-2 weeks 2. Please obtain BMP/CBC in one week 3. We will request PCP to schedule his appointment with sleep studies. 4. He was started on amlodipine 10 mg for his elevated blood pressure. we will request his PCP to titrate as tolerated.  Home Health: No  Equipment/Devices: None  Discharge Condition: stable.  CODE STATUS:Full code  Diet recommendation: Heart Healthy / Carb Modified / Regular / Dysphagia   Brief/Interim Summary: Raymond Chapman a 33 y.o.malepast medical history significant for left ureteral stone, asthma as a child, current smoker who presents in the EDcomplaining of shortness of breath, productive cough, body aches for the last 2 days prior to admission.Healso reportssore throat., Nonspecificchest and abdominal pain from frequent coughing. He denies sick contacts. Years ago he had a bad case of influenza. He does not believe in vaccine, He has not gotten his flu vaccine this year.Patient also reportedlow-grade fever at home. In ED: Patient was found to be hypoxic 85 on room air, hypertensive, tachypneic, respiration rate 25, tachycardic 108.CRP 4.9, procalcitonin less than 0.1,  white blood cell 11,  D dimer negative.Chest x-ray with mild pulmonary vascular congestion. He received IV Solu-Medrol 125 mg and albuterol. SARS coronavirus 2and Influenzacame back negative.  He was admitted for possible COPD exacerbation.  Hospital course:  1.  Acute Hypoxic respiratory failure secondary to COPD exacerbation in current smoker He was admitted for Acute hypoxic respiratory failure, presentedwith oxygen saturation 85 on room air which could be related to COPD exacerbation in a  current smoker, O2 sats improved on 2 L  97%.chest x-ray with pulmonary vascular congestion,echoshowing EF 55 to 60%, grade 1 diastolic dysfunction. Hewascontinued onIV Solu-Medrol 60 mg every 6 hours.Given2 doses of IV Lasix.Dose of Solu-Medrol decreased to every 12 hours in anticipation of discharge on day 3. However, this has caused worsening of his wheezing. He was getting schedulednebulizer treatments,albuterol ipratropium. Oral guaifenesin for cough. He was given azithromycinfor likely bronchitis. He completed five days in hospital.  He has gotten better on day 4.  He does not require supplemental oxygen.  His oxygen saturation on ambulation remains  baseline.  At home he is not on oxygen.  The patient is being discharged on Medrol pack, albuterol inhaler and Mucinex.  2.Morbid Obesity: He was advised to lose weight.  3-current smoker: Counseling provided. He is willing to stop. He is being referred to PCPto be started on smoking cessation programs.  4-mild hyponatremia: Resolved.   5-leukocytosis: Initial WBC count was high but later trended down. Elevated white cell count on discharge secondary to the high-dose Solu-Medrol. Afebrile during hospitalization  6-Elevated BP;no prior history of HTN.However, patienthas not seenprimary care physician in several years. 12/16/2018: Started on 10 mg Norvasc. Continue PRN hydralazine.He is advised to follow-up with outpatient physician.  We will request PCP to titrate his blood pressure medication.   Discharge Diagnoses:  Active Problems:   Respiratory failure (HCC)   COPD exacerbation [facility])  Discharge Instructions  Discharge Instructions    Call MD for:  difficulty breathing, headache or visual disturbances   Complete by: As directed    Call MD for:  extreme fatigue   Complete by: As directed    Call MD for:  hives   Complete  by: As directed    Call MD for:  persistant dizziness or light-headedness    Complete by: As directed    Call MD for:  persistant nausea and vomiting   Complete by: As directed    Call MD for:  redness, tenderness, or signs of infection (pain, swelling, redness, odor or green/yellow discharge around incision site)   Complete by: As directed    Call MD for:  severe uncontrolled pain   Complete by: As directed    Call MD for:  temperature >100.4   Complete by: As directed    Diet - low sodium heart healthy   Complete by: As directed    Discharge instructions   Complete by: As directed    Follow-up PCP in 3 to 4 days. Patient completed 5 days of Zithromax. Advised to continue Medrol pack as directed and Mucinex as needed. We will request PCP to schedule his appointment with sleep studies.   Increase activity slowly   Complete by: As directed      Allergies as of 12/18/2018      Reactions   Demerol [meperidine] Other (See Comments)   hyperactivity      Medication List    STOP taking these medications   acetaminophen 325 MG tablet Commonly known as: TYLENOL   ibuprofen 800 MG tablet Commonly known as: ADVIL   ondansetron 4 MG disintegrating tablet Commonly known as: Zofran ODT   oxyCODONE-acetaminophen 5-325 MG tablet Commonly known as: Percocet   tamsulosin 0.4 MG Caps capsule Commonly known as: FLOMAX     TAKE these medications   amLODipine 10 MG tablet Commonly known as: NORVASC Take 1 tablet (10 mg total) by mouth daily.   guaiFENesin 600 MG 12 hr tablet Commonly known as: MUCINEX Take 1 tablet (600 mg total) by mouth 2 (two) times daily.   methylPREDNISolone 4 MG Tbpk tablet Commonly known as: MEDROL DOSEPAK TAKE AS DIRECTED.       Allergies  Allergen Reactions  . Demerol [Meperidine] Other (See Comments)    hyperactivity    Consultations:  None.   Procedures/Studies: Dg Chest Port 1 View  Result Date: 12/14/2018 CLINICAL DATA:  Cough, shortness of breath EXAM: PORTABLE CHEST 1 VIEW COMPARISON:  03/25/2013  FINDINGS: Examination is somewhat limited secondary to poor penetration from patient body habitus. The heart size and mediastinal contours are within normal limits. Mild pulmonary vascular congestion. No focal airspace consolidation. No pleural effusion or pneumothorax. The visualized skeletal structures are unremarkable. IMPRESSION: Mild pulmonary vascular congestion. Electronically Signed   By: Davina Poke M.D.   On: 12/14/2018 10:47     . Echocardiogram :   IMPRESSIONS    1. Left ventricular ejection fraction, by visual estimation, is 55 to 60%. The left ventricle has normal function. There is no left ventricular hypertrophy.  2. Left ventricular diastolic parameters are consistent with Grade I diastolic dysfunction (impaired relaxation).  3. Global right ventricle has normal systolic function.The right ventricular size is normal. No increase in right ventricular wall thickness.  4. Left atrial size was normal.  5. Right atrial size was normal.  6. The mitral valve is normal in structure. No evidence of mitral valve regurgitation. No evidence of mitral stenosis.  7. The tricuspid valve is normal in structure. Tricuspid valve regurgitation is trivial.  8. The aortic valve is normal in structure. Aortic valve regurgitation is not visualized. No evidence of aortic valve sclerosis or stenosis.  9. The pulmonic valve was normal in structure. Pulmonic  valve regurgitation is not visualized. 10. Normal pulmonary artery systolic pressure. 11. The inferior vena cava is normal in size with greater than 50% respiratory variability, suggesting right atrial pressure of 3 mmHg.   Subjective: Patient seen and examined at bedside,  feels much better, denies any shortness of breath,  wants to go home.  Discharge Exam: Vitals:   12/18/18 0537 12/18/18 0748  BP: (!) 146/88   Pulse: 60 68  Resp: 18 18  Temp: 97.6 F (36.4 C)   SpO2: 94% 96%   Vitals:   12/17/18 1926 12/17/18 2102 12/18/18  0537 12/18/18 0748  BP:  (!) 173/77 (!) 146/88   Pulse:  (!) 58 60 68  Resp:  16 18 18   Temp:  97.6 F (36.4 C) 97.6 F (36.4 C)   TempSrc:      SpO2: 94% 92% 94% 96%  Weight:      Height:        General: Pt is alert, awake, not in acute distress Cardiovascular: RRR, S1/S2 +, no rubs, no gallops Respiratory: CTA bilaterally, no wheezing, no rhonchi Abdominal: Soft, NT, ND, bowel sounds + Extremities: no edema, no cyanosis    The results of significant diagnostics from this hospitalization (including imaging, microbiology, ancillary and laboratory) are listed below for reference.     Microbiology: Recent Results (from the past 240 hour(s))  SARS CORONAVIRUS 2 (TAT 6-24 HRS) Nasopharyngeal Nasopharyngeal Swab     Status: None   Collection Time: 12/14/18  9:43 AM   Specimen: Nasopharyngeal Swab  Result Value Ref Range Status   SARS Coronavirus 2 NEGATIVE NEGATIVE Final    Comment: (NOTE) SARS-CoV-2 target nucleic acids are NOT DETECTED. The SARS-CoV-2 RNA is generally detectable in upper and lower respiratory specimens during the acute phase of infection. Negative results do not preclude SARS-CoV-2 infection, do not rule out co-infections with other pathogens, and should not be used as the sole basis for treatment or other patient management decisions. Negative results must be combined with clinical observations, patient history, and epidemiological information. The expected result is Negative. Fact Sheet for Patients: 13/10/20 Fact Sheet for Healthcare Providers: HairSlick.no This test is not yet approved or cleared by the [place] FDA and  has been authorized for detection and/or diagnosis of SARS-CoV-2 by FDA under an Emergency Use Authorization (EUA). This EUA will remain  in effect (meaning this test can be used) for the duration of the COVID-19 declaration under Section 56 4(b)(1) of the Act, 21  U.S.C. section 360bbb-3(b)(1), unless the authorization is terminated or revoked sooner. Performed at [facility], [address] [place], [place]   blood cx x2     Status: None (Preliminary result)   Collection Time: 12/14/18  9:45 AM   Specimen: BLOOD  Result Value Ref Range Status   Specimen Description   Final    BLOOD LEFT ANTECUBITAL Performed at [facility], [address], [place], [place]    Special Requests   Final    BOTTLES DRAWN AEROBIC AND ANAEROBIC Blood Culture results may not be optimal due to an excessive volume of blood received in culture bottles Performed at [facility], [address] [place], [place]    Culture   Final    NO GROWTH 3 DAYS Performed at [facility], [address] [place], [place]    Report Status PENDING  Incomplete  blood cx x2     Status:  None (Preliminary result)   Collection Time: 12/14/18 10:05 AM   Specimen: BLOOD RIGHT HAND  Result Value Ref Range Status   Specimen Description   Final    BLOOD RIGHT HAND Performed at [facility], [address] [place], [place] [address]    Special Requests   Final    BOTTLES DRAWN AEROBIC AND ANAEROBIC Blood Culture results may not be optimal due to an excessive volume of blood received in culture bottles Performed at [facility], [address] [place], [place] [address]    Culture   Final    NO GROWTH 3 DAYS Performed at [facility], [address] [place], [place] [address]    Report Status PENDING  Incomplete     Labs: BNP (last 3 results) Recent Labs    12/14/18 0943  BNP 38.5   Basic Metabolic Panel: Recent Labs  Lab 12/14/18 0943 12/16/18 0509 12/17/18 0553 12/18/18 0546  NA 133* 136 135 136  K 4.1 4.2 4.4 4.3  CL 97* 98 99 100  CO2 24 27 26 25   GLUCOSE 103* 128* 148* 131*  BUN 11 19 20 20   CREATININE 0.92 0.93 0.86 0.86   CALCIUM 9.2 8.8* 8.8* 9.0  MG  --  2.2  --  2.2  PHOS  --   --   --  4.2   Liver Function Tests: Recent Labs  Lab 12/14/18 0943 12/18/18 0546  AST 30 26  ALT 31 45*  ALKPHOS 81 61  BILITOT 1.0 0.5  PROT 8.5* 7.7  ALBUMIN 4.5 3.7   No results for input(s): LIPASE, AMYLASE in the last 168 hours. No results for input(s): AMMONIA in the last 168 hours. CBC: Recent Labs  Lab 12/14/18 0943 12/16/18 0509 12/17/18 0553 12/18/18 0546  WBC 11.9* 25.7* 28.5* 27.8*  NEUTROABS 8.8*  --  23.5*  --   HGB 16.4 16.4 16.4 17.1*  HCT 50.2 51.5 51.0 53.9*  MCV 87.2 88.9 88.5 88.4  PLT 265 298 322 345   Cardiac Enzymes: No results for input(s): CKTOTAL, CKMB, CKMBINDEX, TROPONINI in the last 168 hours. BNP: Invalid input(s): POCBNP CBG: No results for input(s): GLUCAP in the last 168 hours. D-Dimer No results for input(s): DDIMER in the last 72 hours. Hgb A1c No results for input(s): HGBA1C in the last 72 hours. Lipid Profile No results for input(s): CHOL, HDL, LDLCALC, TRIG, CHOLHDL, LDLDIRECT in the last 72 hours. Thyroid function studies No results for input(s): TSH, T4TOTAL, T3FREE, THYROIDAB in the last 72 hours.  Invalid input(s): FREET3 Anemia work up No results for input(s): VITAMINB12, FOLATE, FERRITIN, TIBC, IRON, RETICCTPCT in the last 72 hours. Urinalysis    Component Value Date/Time   COLORURINE AMBER (A) 12/28/2015 1428   APPEARANCEUR CLOUDY (A) 12/28/2015 1428   LABSPEC 1.027 12/28/2015 1428   PHURINE 5.0 12/28/2015 1428   GLUCOSEU NEGATIVE 12/28/2015 1428   HGBUR LARGE (A) 12/28/2015 1428   BILIRUBINUR NEGATIVE 12/28/2015 1428   KETONESUR NEGATIVE 12/28/2015 1428   PROTEINUR NEGATIVE 12/28/2015 1428   UROBILINOGEN 1.0 08/17/2014 1140   NITRITE NEGATIVE 12/28/2015 1428   LEUKOCYTESUR NEGATIVE 12/28/2015 1428   Sepsis Labs Invalid input(s): PROCALCITONIN,  WBC,  LACTICIDVEN Microbiology Recent Results (from the past 240 hour(s))  SARS CORONAVIRUS 2  (TAT 6-24 HRS) Nasopharyngeal Nasopharyngeal Swab     Status: None   Collection Time: 12/14/18  9:43 AM   Specimen: Nasopharyngeal Swab  Result Value Ref Range Status   SARS  Coronavirus 2 NEGATIVE NEGATIVE Final    Comment: (NOTE) SARS-CoV-2 target nucleic acids are NOT DETECTED. The SARS-CoV-2 RNA is generally detectable in upper and lower respiratory specimens during the acute phase of infection. Negative results do not preclude SARS-CoV-2 infection, do not rule out co-infections with other pathogens, and should not be used as the sole basis for treatment or other patient management decisions. Negative results must be combined with clinical observations, patient history, and epidemiological information. The expected result is Negative. Fact Sheet for Patients: HairSlick.no Fact Sheet for Healthcare Providers: quierodirigir.com This test is not yet approved or cleared by the [place] FDA and  has been authorized for detection and/or diagnosis of SARS-CoV-2 by FDA under an Emergency Use Authorization (EUA). This EUA will remain  in effect (meaning this test can be used) for the duration of the COVID-19 declaration under Section 56 4(b)(1) of the Act, 21 U.S.C. section 360bbb-3(b)(1), unless the authorization is terminated or revoked sooner. Performed at [facility], [address] [place], [place] [address]   blood cx x2     Status: None (Preliminary result)   Collection Time: 12/14/18  9:45 AM   Specimen: BLOOD  Result Value Ref Range Status   Specimen Description   Final    BLOOD LEFT ANTECUBITAL Performed at [facility], [address], [place], [place] [address]    Special Requests   Final    BOTTLES DRAWN AEROBIC AND ANAEROBIC Blood Culture results may not be optimal due to an excessive volume of blood received in culture bottles Performed at [facility], [address], [place], [place] [address]    Culture   Final    NO GROWTH 3 DAYS Performed at [facility], [address], [place], [place] [address]    Report Status PENDING  Incomplete  blood cx x2     Status: None (Preliminary result)   Collection Time: 12/14/18 10:05 AM   Specimen: BLOOD RIGHT HAND  Result Value Ref Range Status   Specimen Description   Final    BLOOD RIGHT HAND Performed at [facility], [address] [place], [place] [address]    Special Requests   Final    BOTTLES DRAWN AEROBIC AND ANAEROBIC Blood Culture results may not be optimal due to an excessive volume of blood received in culture bottles Performed at [facility], [address] [place], [place] [address]    Culture   Final    NO GROWTH 3 DAYS Performed at [facility], [address], [place], [place] [address]    Report Status PENDING  Incomplete     Time coordinating discharge: Over 30 minutes  SIGNED:   Cipriano Bunker, MD  [facility] 12/18/2018, 10:10 AM Pager   If 7PM-7AM, please contact night-coverage [email or web address]

## 2018-12-18 NOTE — Discharge Instructions (Signed)
Follow-up PCP in 3 to 4 days. Patient completed 5 days of Zithromax. Advised to continue Medrol pack as directed and Mucinex as needed. We will request PCP to schedule his appointment with sleep studies. He was started on amlodipine 10 mg for his elevated blood pressure we will repeat his PCP to titrate as tolerated.

## 2018-12-18 NOTE — Progress Notes (Signed)
Nsg Discharge Note  Admit Date:  12/14/2018 Discharge date: 12/18/2018   Marcha Dutton to be D/C'd home per MD order.  AVS completed.  Copy for chart, and copy for patient signed, and dated. Patient/caregiver able to verbalize understanding.  Discharge Medication: Allergies as of 12/18/2018      Reactions   Demerol [meperidine] Other (See Comments)   hyperactivity      Medication List    STOP taking these medications   acetaminophen 325 MG tablet Commonly known as: TYLENOL   ibuprofen 800 MG tablet Commonly known as: ADVIL   ondansetron 4 MG disintegrating tablet Commonly known as: Zofran ODT   oxyCODONE-acetaminophen 5-325 MG tablet Commonly known as: Percocet   tamsulosin 0.4 MG Caps capsule Commonly known as: FLOMAX     TAKE these medications   amLODipine 10 MG tablet Commonly known as: NORVASC Take 1 tablet (10 mg total) by mouth daily.   guaiFENesin 600 MG 12 hr tablet Commonly known as: MUCINEX Take 1 tablet (600 mg total) by mouth 2 (two) times daily.   methylPREDNISolone 4 MG Tbpk tablet Commonly known as: MEDROL DOSEPAK TAKE AS DIRECTED.       Discharge Assessment: Vitals:   12/18/18 0537 12/18/18 0748  BP: (!) 146/88   Pulse: 60 68  Resp: 18 18  Temp: 97.6 F (36.4 C)   SpO2: 94% 96%   Skin clean, dry and intact without evidence of skin break down, no evidence of skin tears noted. IV catheter discontinued intact. Site without signs and symptoms of complications - no redness or edema noted at insertion site, patient denies c/o pain - only slight tenderness at site.  Dressing with slight pressure applied.  D/c Instructions-Education: Discharge instructions given to patient/family with verbalized understanding. D/c education completed with patient/family including follow up instructions, medication list, d/c activities limitations if indicated, with other d/c instructions as indicated by MD - patient able to verbalize understanding, all questions  fully answered. Patient instructed to return to ED, call 911, or call MD for any changes in condition.  Patient escorted via [place], and D/C home via private auto.  Eustace Pen, LPN [id number]/[id number] 41:32 AM

## 2018-12-18 NOTE — TOC Progression Note (Signed)
Transition of Care [facility]) - Progression Note    Patient Details  Name: Brittian Renaldo MRN: [id number] Date of Birth: 07/27/1985  Transition of Care [facility]) CM/SW Contact  Joaquin Courts, RN Phone Number: 12/18/2018, 1:35 PM  Clinical Narrative:    CM spoke with patient vis telephone. Patient reports has no insurance and no pcp. CM provided information about the [facility] as well as contact phone number. Explained that clinic provided services for uninsured patients and has financial assistance programs that the patient will need to fill out an application to apply. Patient instructed to call Monday morning to schedule follow-up appointment and establish pcp, the clinic is closed on the weekends.     Expected Discharge Plan: Home/Self Care Barriers to Discharge: No Barriers Identified  Expected Discharge Plan and Services Expected Discharge Plan: Home/Self Care   Discharge Planning Services: CM Consult   Living arrangements for the past 2 months: Single Family Home Expected Discharge Date: 12/18/18               DME Arranged: N/A DME Agency: NA       HH Arranged: NA HH Agency: NA         Social Determinants of Health (SDOH) Interventions    Readmission Risk Interventions No flowsheet data found.

## 2018-12-19 LAB — CULTURE, BLOOD (ROUTINE X 2)
Culture: NO GROWTH
Culture: NO GROWTH

## 2018-12-21 ENCOUNTER — Telehealth: Payer: Self-pay

## 2018-12-21 NOTE — Telephone Encounter (Signed)
Message received from Nancy Marus, RN CM requesting a hospital follow up appointment for the patient.   Attempted to contact patient # [phone] but the phone just rang busy.

## 2018-12-23 ENCOUNTER — Telehealth: Payer: Self-pay

## 2018-12-23 NOTE — Telephone Encounter (Signed)
Attempted again to schedule hospital follow up appointment for the patient.   Call placed to # [phone], voicemail not set up, unable to leave message. Call placed to # [phone], phone just rings, no option to leave a message

## 2019-01-19 ENCOUNTER — Other Ambulatory Visit: Payer: Self-pay

## 2019-01-19 ENCOUNTER — Ambulatory Visit: Payer: Self-pay | Attending: Family Medicine | Admitting: Family Medicine

## 2019-01-19 ENCOUNTER — Encounter: Payer: Self-pay | Admitting: Family Medicine

## 2019-01-19 NOTE — Progress Notes (Signed)
Patient was called to start Tele-visit with PCP. Patient was informed of tele-visit yesterday. Once called patient states that he did not want to do the tele-visit he wanted to be seen in person.  Patient was scheduled for a tele-visit due to inclement weather.

## 2019-08-25 ENCOUNTER — Ambulatory Visit: Payer: Self-pay | Admitting: Physician Assistant

## 2019-08-26 ENCOUNTER — Other Ambulatory Visit: Payer: Self-pay

## 2019-08-26 ENCOUNTER — Encounter: Payer: Self-pay | Admitting: Physician Assistant

## 2019-08-26 ENCOUNTER — Ambulatory Visit (INDEPENDENT_AMBULATORY_CARE_PROVIDER_SITE_OTHER): Payer: Self-pay | Admitting: Physician Assistant

## 2019-08-26 DIAGNOSIS — L732 Hidradenitis suppurativa: Secondary | ICD-10-CM

## 2019-08-26 DIAGNOSIS — Z1283 Encounter for screening for malignant neoplasm of skin: Secondary | ICD-10-CM

## 2019-08-26 MED ORDER — RIFAMPIN 300 MG PO CAPS: 300.0000 mg | ORAL_CAPSULE | Freq: Two times a day (BID) | ORAL | 0 refills | Status: AC

## 2019-08-26 MED ORDER — CLINDAMYCIN HCL 300 MG PO CAPS: 300.0000 mg | ORAL_CAPSULE | Freq: Two times a day (BID) | ORAL | 0 refills | Status: AC

## 2019-08-26 NOTE — Progress Notes (Signed)
   Follow-Up Visit   Subjective  Raymond Chapman is a 34 y.o. male who presents for the following: Skin Problem (painful boils, groin, armpit and buttocks, no history of treatment).   The following portions of the chart were reviewed this encounter and updated as appropriate: Tobacco  Allergies  Meds  Problems  Med Hx  Surg Hx  Fam Hx      Objective  Well appearing patient in no apparent distress; mood and affect are within normal limits.  A full examination was performed including scalp, head, eyes, ears, nose, lips, neck, chest, axillae, abdomen, back, buttocks, bilateral upper extremities, bilateral lower extremities, hands, feet, fingers, toes, fingernails, and toenails. All findings within normal limits unless otherwise noted below.  Objective  Left Hip (side) - Posterior, Left Thigh - Anterior, Left Upper Arm - Anterior, Right Axilla, Right Hip (side) - Posterior, Right Thigh - Anterior: Large cysts with scars and tracks. Red with drainage.  Objective  Left Shoulder - Posterior, Right Upper Back, head to toe: No atypical nevi No signs of non-mole skin cancer. No atypical nevi No signs of non-mole skin cancer.    Assessment & Plan  Hidradenitis suppurativa (6) Left Hip (side) - Posterior; Right Hip (side) - Posterior; Left Upper Arm - Anterior; Left Thigh - Anterior; Right Thigh - Anterior; Right Axilla  clindamycin (CLEOCIN) 300 MG capsule - Left Hip (side) - Posterior, Left Thigh - Anterior, Left Upper Arm - Anterior, Right Axilla, Right Hip (side) - Posterior, Right Thigh - Anterior  rifampin (RIFADIN) 300 MG capsule - Left Hip (side) - Posterior, Left Thigh - Anterior, Left Upper Arm - Anterior, Right Axilla, Right Hip (side) - Posterior, Right Thigh - Anterior  Screening exam for skin cancer (3) Left Shoulder - Posterior; Right Upper Back; head to toe  observe    I, Alanie Syler, PA-C, have reviewed all documentation's for this visit.  The documentation on  08/26/19 for the exam, diagnosis, procedures and orders are all accurate and complete.

## 2019-08-26 NOTE — Patient Instructions (Signed)
Patient will call if he wants to start humira. He will need biologic blood panel before we can send over the paper work.

## 2019-09-22 ENCOUNTER — Ambulatory Visit (INDEPENDENT_AMBULATORY_CARE_PROVIDER_SITE_OTHER): Payer: Self-pay | Admitting: Physician Assistant

## 2019-09-22 ENCOUNTER — Other Ambulatory Visit: Payer: Self-pay

## 2019-09-22 ENCOUNTER — Encounter: Payer: Self-pay | Admitting: Physician Assistant

## 2019-09-22 DIAGNOSIS — L732 Hidradenitis suppurativa: Secondary | ICD-10-CM

## 2019-09-22 NOTE — Progress Notes (Addendum)
   Follow-Up Visit   Subjective  Raymond Chapman is a 34 y.o. male who presents for the following: Follow-up (Pt stated antibiotic is not working. -).   The following portions of the chart were reviewed this encounter and updated as appropriate: Tobacco  Allergies  Meds  Problems  Med Hx  Surg Hx  Fam Hx      Objective  Well appearing patient in no apparent distress; mood and affect are within normal limits.  A full examination was performed including scalp, head, eyes, ears, nose, lips, neck, chest, axillae, abdomen, back, buttocks, bilateral upper extremities, bilateral lower extremities, hands, feet, fingers, toes, fingernails, and toenails. All findings within normal limits unless otherwise noted below.  Objective  Left Abdomen (side) - Upper, Left Hip (side) - Posterior, Left Medial Thigh, Mons Pubis, Posterior Scrotum, Right Hip (side) - Posterior, Right Medial Thigh: Red nodules with comedomal tracks. Large cysts have gotten smaller without redness.  Has a few  small red nodules on abdomen, buttocks and both axillae.  Assessment & Plan  Hidradenitis suppurativa (7) Left Hip (side) - Posterior; Right Hip (side) - Posterior; Left Abdomen (side) - Upper; Left Medial Thigh; Right Medial Thigh; Posterior Scrotum; Mons Pubis  Finish clindamycin and rifampin.  He doesn't like the idea of Humira right now. No insurance. We will treat with antibiotics prn and drain painful cysts as needed for flares.  clindamycin (CLEOCIN) 300 MG capsule - Left Abdomen (side) - Upper, Left Hip (side) - Posterior, Left Medial Thigh, Mons Pubis, Posterior Scrotum, Right Hip (side) - Posterior, Right Medial Thigh  rifampin (RIFADIN) 300 MG capsule - Left Abdomen (side) - Upper, Left Hip (side) - Posterior, Left Medial Thigh, Mons Pubis, Posterior Scrotum, Right Hip (side) - Posterior, Right Medial Thigh   I, Eliot Popper, PA-C, have reviewed all documentation's for this visit.  The documentation on  09/22/19 for the exam, diagnosis, procedures and orders are all accurate and complete.

## 2021-02-20 ENCOUNTER — Other Ambulatory Visit: Payer: Self-pay | Admitting: Occupational Medicine

## 2021-02-20 ENCOUNTER — Ambulatory Visit: Payer: Self-pay

## 2021-02-20 ENCOUNTER — Other Ambulatory Visit: Payer: Self-pay

## 2021-02-20 DIAGNOSIS — M79672 Pain in left foot: Secondary | ICD-10-CM

## 2021-02-20 DIAGNOSIS — M25572 Pain in left ankle and joints of left foot: Secondary | ICD-10-CM

## 2023-05-25 ENCOUNTER — Encounter (HOSPITAL_BASED_OUTPATIENT_CLINIC_OR_DEPARTMENT_OTHER): Payer: Self-pay | Admitting: Emergency Medicine

## 2023-05-25 ENCOUNTER — Other Ambulatory Visit: Payer: Self-pay

## 2023-05-25 ENCOUNTER — Emergency Department (HOSPITAL_BASED_OUTPATIENT_CLINIC_OR_DEPARTMENT_OTHER)
Admission: EM | Admit: 2023-05-25 | Discharge: 2023-05-25 | Disposition: A | Discharge status: Home / Self Care | Payer: Self-pay | Attending: Emergency Medicine | Admitting: Emergency Medicine

## 2023-05-25 DIAGNOSIS — L0291 Cutaneous abscess, unspecified: Secondary | ICD-10-CM

## 2023-05-25 DIAGNOSIS — L02416 Cutaneous abscess of left lower limb: Secondary | ICD-10-CM | POA: Insufficient documentation

## 2023-05-25 MED ORDER — LIDOCAINE-EPINEPHRINE-TETRACAINE (LET) TOPICAL GEL
3.0000 mL | Freq: Once | TOPICAL | Status: AC
  Administered 2023-05-25: 3 mL via TOPICAL
  Filled 2023-05-25: qty 3

## 2023-05-25 MED ORDER — DOXYCYCLINE HYCLATE 100 MG PO CAPS: 100.0000 mg | ORAL_CAPSULE | Freq: Two times a day (BID) | ORAL | 0 refills | Status: AC

## 2023-05-25 MED ORDER — ACETAMINOPHEN 500 MG PO TABS
1000.0000 mg | ORAL_TABLET | Freq: Once | ORAL | Status: AC
  Administered 2023-05-25: 1000 mg via ORAL
  Filled 2023-05-25: qty 2

## 2023-05-25 MED ORDER — LIDOCAINE-EPINEPHRINE (PF) 2 %-1:200000 IJ SOLN
10.0000 mL | Freq: Once | INTRAMUSCULAR | Status: AC
  Administered 2023-05-25: 10 mL
  Filled 2023-05-25: qty 20

## 2023-05-25 NOTE — ED Triage Notes (Signed)
 Right posterior thigh abscess x 1 week , unable to sit due to pain , chills . Feeling dizzy due to pain and vomiting .

## 2023-05-25 NOTE — ED Notes (Signed)
 Pt. Has noted abscess on his R butt cheek with redness and abraded look to the area.  Pt. Has noted edema and pink skin around the area.

## 2023-05-25 NOTE — ED Provider Notes (Signed)
 [facility] Provider Note   CSN: [id number] Arrival date & time: 05/25/23  1158     History  Chief Complaint  Patient presents with   Abscess    Raymond Chapman is a 38 y.o. male. With past medical history of HS reporting with abscess right upper thigh. Patient has history of similar. Has been present for 1 month and painful and red for 1 week. Has noted chills and nausea. Temp 34F, no known fever.  Denies purulent drainage, rectal drainage, no scrotal involvement.    Abscess      Home Medications Prior to Admission medications   Medication Sig Start Date End Date Taking? Authorizing Provider  clindamycin  (CLEOCIN ) 300 MG capsule Take 1 capsule (300 mg total) by mouth 2 (two) times daily. 08/26/19   Sheffield, Kelli R, PA-C  rifampin  (RIFADIN ) 300 MG capsule Take 1 capsule (300 mg total) by mouth 2 (two) times daily. 08/26/19   Sheffield, Kelli R, PA-C      Allergies    Demerol  [meperidine ]    Review of Systems   Review of Systems  Skin:  Positive for wound.    Physical Exam Updated Vital Signs BP (!) 158/96   Pulse (!) 110   Temp 98.8 F (37.1 C) (Oral)   Resp 20   Wt (!) 161.6 kg   SpO2 95%   BMI 49.68 kg/m  Physical Exam Vitals and nursing note reviewed.  Constitutional:      General: He is not in acute distress.    Appearance: He is not toxic-appearing.  HENT:     Head: Normocephalic and atraumatic.  Eyes:     General: No scleral icterus.    Conjunctiva/sclera: Conjunctivae normal.  Cardiovascular:     Rate and Rhythm: Normal rate and regular rhythm.     Pulses: Normal pulses.     Heart sounds: Normal heart sounds.  Pulmonary:     Effort: Pulmonary effort is normal. No respiratory distress.     Breath sounds: Normal breath sounds.  Abdominal:     General: Abdomen is flat. Bowel sounds are normal.     Palpations: Abdomen is soft.     Tenderness: There is no abdominal tenderness.  Musculoskeletal:      Right lower leg: No edema.     Left lower leg: No edema.  Skin:    General: Skin is warm and dry.     Findings: No lesion.     Comments: Left posterior thigh abscess with mild/mod surrounding erythema and area of fluctuance.    Neurological:     General: No focal deficit present.     Mental Status: He is alert and oriented to person, place, and time. Mental status is at baseline.     ED Results / Procedures / Treatments   Labs (all labs ordered are listed, but only abnormal results are displayed) Labs Reviewed - No data to display  EKG None  Radiology No results found.  Procedures .Incision and Drainage  Date/Time: 05/25/2023 2:57 PM  Performed by: Eudora Heron, PA-C Authorized by: Eudora Heron, PA-C   Consent:    Consent obtained:  Verbal   Consent given by:  Patient   Risks, benefits, and alternatives were discussed: yes     Risks discussed:  Bleeding, damage to other organs, infection, incomplete drainage and pain   Alternatives discussed:  No treatment, delayed treatment, alternative treatment, observation and referral Universal protocol:    Procedure explained and  questions answered to patient or proxy's satisfaction: yes     Relevant documents present and verified: yes     Test results available : yes     Imaging studies available: yes     Required blood products, implants, devices, and special equipment available: yes     Site/side marked: yes     Immediately prior to procedure, a time out was called: yes     Patient identity confirmed:  Verbally with patient Location:    Type:  Abscess   Location:  Lower extremity   Lower extremity location:  Leg   Leg location:  L upper leg Pre-procedure details:    Skin preparation:  Antiseptic wash Sedation:    Sedation type:  None Anesthesia:    Anesthesia method:  Topical application and local infiltration   Topical anesthetic:  LET   Local anesthetic:  Lidocaine  2% WITH epi Procedure type:    Complexity:   Simple Procedure details:    Ultrasound guidance: yes     Incision types:  Stab incision   Incision depth:  Dermal   Wound management:  Probed and deloculated   Drainage:  Bloody and purulent   Drainage amount:  Moderate   Wound treatment:  Wound left open   Packing materials:  1/2 in iodoform gauze Post-procedure details:    Procedure completion:  Tolerated     Medications Ordered in ED Medications - No data to display  ED Course/ Medical Decision Making/ A&P                                 Medical Decision Making Risk OTC drugs. Prescription drug management.   This patient presents to the ED for concern of abscess, this involves an extensive number of treatment options, and is a complaint that carries with it a high risk of complications and morbidity.  The differential diagnosis includes cellulitis, abscess, sebaceous cyst, lipoma, sepsis   Problem List / ED Course / Critical interventions / Medication management  Patient reporting to emergency room with abscess.  He does have large area of fluctuance.  Does have mild to moderate surrounding erythema.  He was initially tachycardic on arrival which I suspect is secondary to pain.  He has no fever or documented fever at home.  He appears systemically ill. Not meeting SIRS criteria.  Used ultrasound to visualize fluid collection.  Performed incision and drainage with improvement of symptoms and improvement of blood collection on POCUS.  Patient was packed with iodoform gauze.  Procedure tolerated well.  Given return precaution.  Given follow-up in 72 hours for wound check. I ordered medication including tylenol  for pain control.  Reevaluation of the patient after these medicines showed that the patient improved I have reviewed the patients home medicines and have made adjustments as needed   Plan  F/u w/ PCP in 2-3d to ensure resolution of sx.  Patient was given return precautions. Patient stable for discharge at this time.   Patient educated on sx/dx and verbalized understanding of plan. Return to ER w/ new or worsening sx.          Final Clinical Impression(s) / ED Diagnoses Final diagnoses:  Abscess    Rx / DC Orders ED Discharge Orders          Ordered    doxycycline  (VIBRAMYCIN ) 100 MG capsule  2 times daily        05/25/23 1500  Adina Ahle 05/25/23 1530    Ninetta Basket, MD 05/31/23 [phone]

## 2023-05-25 NOTE — Discharge Instructions (Signed)
 Follow up for wound check in 72 hours, you will need to have the packing removed at this time.  I have sent abiotics, please take as prescribed. I have send Zofran  for nausea and vomiting.  Take tylenol  and ibuprofen  for pain control.
